# Patient Record
Sex: Female | Born: 1975 | Race: White | Hispanic: No | Marital: Married | Smoking: Current every day smoker
Health system: Southern US, Community
[De-identification: ages and names within clinical notes are randomized; demographics above are authoritative.]

## PROBLEM LIST (undated history)

## (undated) ENCOUNTER — Inpatient Hospital Stay (HOSPITAL_COMMUNITY): Payer: Self-pay

## (undated) DIAGNOSIS — O24419 Gestational diabetes mellitus in pregnancy, unspecified control: Secondary | ICD-10-CM

## (undated) DIAGNOSIS — Z8619 Personal history of other infectious and parasitic diseases: Secondary | ICD-10-CM

## (undated) DIAGNOSIS — E079 Disorder of thyroid, unspecified: Secondary | ICD-10-CM

## (undated) DIAGNOSIS — I10 Essential (primary) hypertension: Secondary | ICD-10-CM

## (undated) DIAGNOSIS — O09529 Supervision of elderly multigravida, unspecified trimester: Secondary | ICD-10-CM

## (undated) DIAGNOSIS — E7800 Pure hypercholesterolemia, unspecified: Secondary | ICD-10-CM

## (undated) DIAGNOSIS — Z3490 Encounter for supervision of normal pregnancy, unspecified, unspecified trimester: Secondary | ICD-10-CM

## (undated) DIAGNOSIS — N96 Recurrent pregnancy loss: Secondary | ICD-10-CM

## (undated) DIAGNOSIS — F17200 Nicotine dependence, unspecified, uncomplicated: Secondary | ICD-10-CM

## (undated) HISTORY — DX: Supervision of elderly multigravida, unspecified trimester: O09.529

## (undated) HISTORY — DX: Disorder of thyroid, unspecified: E07.9

## (undated) HISTORY — DX: Pure hypercholesterolemia, unspecified: E78.00

## (undated) HISTORY — DX: Essential (primary) hypertension: I10

## (undated) HISTORY — DX: Personal history of other infectious and parasitic diseases: Z86.19

## (undated) HISTORY — PX: THYROID SURGERY: SHX805

## (undated) HISTORY — DX: Encounter for supervision of normal pregnancy, unspecified, unspecified trimester: Z34.90

## (undated) HISTORY — DX: Nicotine dependence, unspecified, uncomplicated: F17.200

## (undated) HISTORY — DX: Recurrent pregnancy loss: N96

---

## 2015-12-03 NOTE — L&D Delivery Note (Signed)
40yo now G8P1 s/p NSVD, pregnancy [facility] and GD[facility]. IOL with cytotec, foley and Pitocin.   Delivery Note At 4:53 PM a viable female was delivered via Vaginal, Spontaneous Delivery (Presentation: ROA, compound with hand).  APGAR: 9, 9.  Placenta status: delivered intact with gentle traction   Cord: 3 vessel, with the following complications: none  Anesthesia:  epidural Episiotomy: None Lacerations: Right labial, posterior 1st degree Suture Repair: none Est. Blood Loss (mL): 100 ml    Mom to postpartum.  Baby to Couplet care / Skin to Skin.  Susan Reeves 08/02/2016, 5:16 PM  OB FELLOW DELIVERY ATTESTATION  I was gloved and present for the delivery in its entirety, and I agree with the above resident's note.    I did remove 2 skin tags from pt left interior thigh after delivery. The area was infiltrated with 10 cc's of lidocaine. The mayo scissors where sued to remove skin tag x2.  The areas where hemostatic with pressure.  Susan PennaNicholas Schenk, MD 5:51 PM

## 2015-12-06 ENCOUNTER — Telehealth: Payer: Self-pay | Admitting: Adult Health

## 2015-12-06 ENCOUNTER — Encounter: Payer: Self-pay | Admitting: Adult Health

## 2015-12-06 ENCOUNTER — Ambulatory Visit (INDEPENDENT_AMBULATORY_CARE_PROVIDER_SITE_OTHER): Payer: Federal, State, Local not specified - PPO | Admitting: Adult Health

## 2015-12-06 VITALS — BP 132/82 | HR 96 | Ht 65.5 in | Wt 232.0 lb

## 2015-12-06 DIAGNOSIS — O09521 Supervision of elderly multigravida, first trimester: Secondary | ICD-10-CM

## 2015-12-06 DIAGNOSIS — Z3490 Encounter for supervision of normal pregnancy, unspecified, unspecified trimester: Secondary | ICD-10-CM

## 2015-12-06 DIAGNOSIS — O3680X0 Pregnancy with inconclusive fetal viability, not applicable or unspecified: Secondary | ICD-10-CM

## 2015-12-06 DIAGNOSIS — Z3201 Encounter for pregnancy test, result positive: Secondary | ICD-10-CM

## 2015-12-06 DIAGNOSIS — N926 Irregular menstruation, unspecified: Secondary | ICD-10-CM

## 2015-12-06 DIAGNOSIS — O09899 Supervision of other high risk pregnancies, unspecified trimester: Secondary | ICD-10-CM | POA: Insufficient documentation

## 2015-12-06 DIAGNOSIS — F17200 Nicotine dependence, unspecified, uncomplicated: Secondary | ICD-10-CM

## 2015-12-06 DIAGNOSIS — Z720 Tobacco use: Secondary | ICD-10-CM | POA: Diagnosis not present

## 2015-12-06 DIAGNOSIS — N96 Recurrent pregnancy loss: Secondary | ICD-10-CM

## 2015-12-06 DIAGNOSIS — O09529 Supervision of elderly multigravida, unspecified trimester: Secondary | ICD-10-CM

## 2015-12-06 HISTORY — DX: Nicotine dependence, unspecified, uncomplicated: F17.200

## 2015-12-06 HISTORY — DX: Encounter for supervision of normal pregnancy, unspecified, unspecified trimester: Z34.90

## 2015-12-06 HISTORY — DX: Recurrent pregnancy loss: N96

## 2015-12-06 HISTORY — DX: Supervision of elderly multigravida, unspecified trimester: O09.529

## 2015-12-06 LAB — POCT URINE PREGNANCY: Preg Test, Ur: POSITIVE — AB

## 2015-12-06 MED ORDER — CITRANATAL 90 DHA 90-1 & 300 MG PO MISC: ORAL | Status: DC

## 2015-12-06 NOTE — Patient Instructions (Signed)
First Trimester of Pregnancy The first trimester of pregnancy is from week 1 until the end of week 12 (months 1 through 3). A week after a sperm fertilizes an egg, the egg will implant on the wall of the uterus. This embryo will begin to develop into a baby. Genes from you and your partner are forming the baby. The female genes determine whether the baby is a boy or a girl. At 6-8 weeks, the eyes and face are formed, and the heartbeat can be seen on ultrasound. At the end of 12 weeks, all the baby's organs are formed.  Now that you are pregnant, you will want to do everything you can to have a healthy baby. Two of the most important things are to get good prenatal care and to follow your health care provider's instructions. Prenatal care is all the medical care you receive before the baby's birth. This care will help prevent, find, and treat any problems during the pregnancy and childbirth. BODY CHANGES Your body goes through many changes during pregnancy. The changes vary from woman to woman.   You may gain or lose a couple of pounds at first.  You may feel sick to your stomach (nauseous) and throw up (vomit). If the vomiting is uncontrollable, call your health care provider.  You may tire easily.  You may develop headaches that can be relieved by medicines approved by your health care provider.  You may urinate more often. Painful urination may mean you have a bladder infection.  You may develop heartburn as a result of your pregnancy.  You may develop constipation because certain hormones are causing the muscles that push waste through your intestines to slow down.  You may develop hemorrhoids or swollen, bulging veins (varicose veins).  Your breasts may begin to grow larger and become tender. Your nipples may stick out more, and the tissue that surrounds them (areola) may become darker.  Your gums may bleed and may be sensitive to brushing and flossing.  Dark spots or blotches (chloasma,  mask of pregnancy) may develop on your face. This will likely fade after the baby is born.  Your menstrual periods will stop.  You may have a loss of appetite.  You may develop cravings for certain kinds of food.  You may have changes in your emotions from day to day, such as being excited to be pregnant or being concerned that something may go wrong with the pregnancy and baby.  You may have more vivid and strange dreams.  You may have changes in your hair. These can include thickening of your hair, rapid growth, and changes in texture. Some women also have hair loss during or after pregnancy, or hair that feels dry or thin. Your hair will most likely return to normal after your baby is born. WHAT TO EXPECT AT YOUR PRENATAL VISITS During a routine prenatal visit:  You will be weighed to make sure you and the baby are growing normally.  Your blood pressure will be taken.  Your abdomen will be measured to track your baby's growth.  The fetal heartbeat will be listened to starting around week 10 or 12 of your pregnancy.  Test results from any previous visits will be discussed. Your health care provider may ask you:  How you are feeling.  If you are feeling the baby move.  If you have had any abnormal symptoms, such as leaking fluid, bleeding, severe headaches, or abdominal cramping.  If you are using any tobacco products,   including cigarettes, chewing tobacco, and electronic cigarettes.  If you have any questions. Other tests that may be performed during your first trimester include:  Blood tests to find your blood type and to check for the presence of any previous infections. They will also be used to check for low iron levels (anemia) and Rh antibodies. Later in the pregnancy, blood tests for diabetes will be done along with other tests if problems develop.  Urine tests to check for infections, diabetes, or protein in the urine.  An ultrasound to confirm the proper growth  and development of the baby.  An amniocentesis to check for possible genetic problems.  Fetal screens for spina bifida and Down syndrome.  You may need other tests to make sure you and the baby are doing well.  HIV (human immunodeficiency virus) testing. Routine prenatal testing includes screening for HIV, unless you choose not to have this test. HOME CARE INSTRUCTIONS  Medicines  Follow your health care provider's instructions regarding medicine use. Specific medicines may be either safe or unsafe to take during pregnancy.  Take your prenatal vitamins as directed.  If you develop constipation, try taking a stool softener if your health care provider approves. Diet  Eat regular, well-balanced meals. Choose a variety of foods, such as meat or vegetable-based protein, fish, milk and low-fat dairy products, vegetables, fruits, and whole grain breads and cereals. Your health care provider will help you determine the amount of weight gain that is right for you.  Avoid raw meat and uncooked cheese. These carry germs that can cause birth defects in the baby.  Eating four or five small meals rather than three large meals a day may help relieve nausea and vomiting. If you start to feel nauseous, eating a few soda crackers can be helpful. Drinking liquids between meals instead of during meals also seems to help nausea and vomiting.  If you develop constipation, eat more high-fiber foods, such as fresh vegetables or fruit and whole grains. Drink enough fluids to keep your urine clear or pale yellow. Activity and Exercise  Exercise only as directed by your health care provider. Exercising will help you:  Control your weight.  Stay in shape.  Be prepared for labor and delivery.  Experiencing pain or cramping in the lower abdomen or low back is a good sign that you should stop exercising. Check with your health care provider before continuing normal exercises.  Try to avoid standing for long  periods of time. Move your legs often if you must stand in one place for a long time.  Avoid heavy lifting.  Wear low-heeled shoes, and practice good posture.  You may continue to have sex unless your health care provider directs you otherwise. Relief of Pain or Discomfort  Wear a good support bra for breast tenderness.   Take warm sitz baths to soothe any pain or discomfort caused by hemorrhoids. Use hemorrhoid cream if your health care provider approves.   Rest with your legs elevated if you have leg cramps or low back pain.  If you develop varicose veins in your legs, wear support hose. Elevate your feet for 15 minutes, 3-4 times a day. Limit salt in your diet. Prenatal Care  Schedule your prenatal visits by the twelfth week of pregnancy. They are usually scheduled monthly at first, then more often in the last 2 months before delivery.  Write down your questions. Take them to your prenatal visits.  Keep all your prenatal visits as directed by your   health care provider. Safety  Wear your seat belt at all times when driving.  Make a list of emergency phone numbers, including numbers for family, friends, the hospital, and police and fire departments. General Tips  Ask your health care provider for a referral to a local prenatal education class. Begin classes no later than at the beginning of month 6 of your pregnancy.  Ask for help if you have counseling or nutritional needs during pregnancy. Your health care provider can offer advice or refer you to specialists for help with various needs.  Do not use hot tubs, steam rooms, or saunas.  Do not douche or use tampons or scented sanitary pads.  Do not cross your legs for long periods of time.  Avoid cat litter boxes and soil used by cats. These carry germs that can cause birth defects in the baby and possibly loss of the fetus by miscarriage or stillbirth.  Avoid all smoking, herbs, alcohol, and medicines not prescribed by  your health care provider. Chemicals in these affect the formation and growth of the baby.  Do not use any tobacco products, including cigarettes, chewing tobacco, and electronic cigarettes. If you need help quitting, ask your health care provider. You may receive counseling support and other resources to help you quit.  Schedule a dentist appointment. At home, brush your teeth with a soft toothbrush and be gentle when you floss. SEEK MEDICAL CARE IF:   You have dizziness.  You have mild pelvic cramps, pelvic pressure, or nagging pain in the abdominal area.  You have persistent nausea, vomiting, or diarrhea.  You have a bad smelling vaginal discharge.  You have pain with urination.  You notice increased swelling in your face, hands, legs, or ankles. SEEK IMMEDIATE MEDICAL CARE IF:   You have a fever.  You are leaking fluid from your vagina.  You have spotting or bleeding from your vagina.  You have severe abdominal cramping or pain.  You have rapid weight gain or loss.  You vomit blood or material that looks like coffee grounds.  You are exposed to [place] measles and have never had them.  You are exposed to fifth disease or chickenpox.  You develop a severe headache.  You have shortness of breath.  You have any kind of trauma, such as from a fall or a car accident.   This information is not intended to replace advice given to you by your health care provider. Make sure you discuss any questions you have with your health care provider.   Document Released: 11/12/2001 Document Revised: 12/09/2014 Document Reviewed: 09/28/2013 Elsevier Interactive Patient Education [employer]2016 [employer] Return in 1 day for dating US Decrease cigarettes

## 2015-12-06 NOTE — Progress Notes (Signed)
Subjective:     Patient ID: Susan Reeves, female   DOB: [birth year], 40 y.o.   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  Review of Systems Patient denies any headaches, hearing loss, fatigue, blurred vision, shortness of breath, chest pain, abdominal pain, problems with bowel movements, urination, or intercourse. No joint pain or mood swings. See HPI for positives. Reviewed past medical,surgical, social and family history. Reviewed medications and allergies.     Objective:   Physical Exam BP 132/82 mmHg  Pulse 96  Ht 5' 5.5" (1.664 m)  Wt 232 lb (105.235 kg)  BMI 38.01 kg/m2  LMP 10/22/2015 UPT + about 6+3 weeks by LMP with EDD 07/28/16, Skin warm and dry. Neck: mid line trachea, normal thyroid, good ROM, no lymphadenopathy noted. Lungs: clear to ausculation bilaterally. Cardiovascular: regular rate and rhythm.Abdomen soft non tender. Discussed that she needs to decrease cigarettes, will get labs today, to check thyroid and progesterone level, and then back for US tomorrow, will Rx prenatal vitamins and will discuss labs tomorrow. Face time 20 minutes with pt and husband of 11 years, with 50 % counseling and discussing plans.    Assessment:     Pregnant  History of multiple miscarriages AMA  Smoker     Plan:     Rx citranatal 90 DHA #60 take daily with 11 refills Check TSH, QHCG and progesterone today Return in 1 day for dating US Decrease cigarettes Review handout on first trimester

## 2015-12-06 NOTE — Telephone Encounter (Signed)
Insurance will not cover prenatal vitamins, told pharmacist at [facility] to have pt get OTC prenatal and take 1 daily

## 2015-12-07 ENCOUNTER — Other Ambulatory Visit: Payer: Self-pay | Admitting: Adult Health

## 2015-12-07 ENCOUNTER — Telehealth: Payer: Self-pay | Admitting: Adult Health

## 2015-12-07 ENCOUNTER — Ambulatory Visit (INDEPENDENT_AMBULATORY_CARE_PROVIDER_SITE_OTHER): Payer: Federal, State, Local not specified - PPO

## 2015-12-07 DIAGNOSIS — N96 Recurrent pregnancy loss: Secondary | ICD-10-CM

## 2015-12-07 DIAGNOSIS — O3680X0 Pregnancy with inconclusive fetal viability, not applicable or unspecified: Secondary | ICD-10-CM | POA: Diagnosis not present

## 2015-12-07 DIAGNOSIS — Z3A01 Less than 8 weeks gestation of pregnancy: Secondary | ICD-10-CM

## 2015-12-07 DIAGNOSIS — O3491 Maternal care for abnormality of pelvic organ, unspecified, first trimester: Secondary | ICD-10-CM | POA: Diagnosis not present

## 2015-12-07 LAB — BETA HCG QUANT (REF LAB): HCG QUANT: 2404 m[IU]/mL

## 2015-12-07 LAB — TSH: TSH: 1.53 u[IU]/mL (ref 0.450–4.500)

## 2015-12-07 LAB — PROGESTERONE: PROGESTERONE: 10.7 ng/mL

## 2015-12-07 MED ORDER — PROGESTERONE MICRONIZED 200 MG PO CAPS: ORAL_CAPSULE | ORAL | Status: DC

## 2015-12-07 NOTE — Progress Notes (Signed)
US 5+2wks GS w/ys,no fetal pole seen,normal ov's bilat,mult simple nabothian cysts,pt will come back for f/u ultrasound [facility]

## 2015-12-07 NOTE — Telephone Encounter (Signed)
Pt aware of labs progesterone 10.7 will rx prometrium 200 mg 1 in vagina at hs, discussed with Dr Emelda FearFerguson, US showed IUP

## 2015-12-15 ENCOUNTER — Other Ambulatory Visit: Payer: Self-pay | Admitting: Obstetrics & Gynecology

## 2015-12-15 DIAGNOSIS — O3680X0 Pregnancy with inconclusive fetal viability, not applicable or unspecified: Secondary | ICD-10-CM

## 2015-12-18 ENCOUNTER — Ambulatory Visit (INDEPENDENT_AMBULATORY_CARE_PROVIDER_SITE_OTHER): Payer: Federal, State, Local not specified - PPO

## 2015-12-18 DIAGNOSIS — O3680X0 Pregnancy with inconclusive fetal viability, not applicable or unspecified: Secondary | ICD-10-CM | POA: Diagnosis not present

## 2015-12-18 DIAGNOSIS — Z3A01 Less than 8 weeks gestation of pregnancy: Secondary | ICD-10-CM

## 2015-12-18 NOTE — Progress Notes (Signed)
US 6+4 wks,single IUP pos fht 121 bpm,normal ov's bilat, crl 7.456mm

## 2015-12-21 ENCOUNTER — Ambulatory Visit (INDEPENDENT_AMBULATORY_CARE_PROVIDER_SITE_OTHER): Payer: BLUE CROSS/BLUE SHIELD | Admitting: Advanced Practice Midwife

## 2015-12-21 ENCOUNTER — Encounter: Payer: Self-pay | Admitting: Advanced Practice Midwife

## 2015-12-21 VITALS — BP 142/80 | HR 92 | Wt 232.0 lb

## 2015-12-21 DIAGNOSIS — O10919 Unspecified pre-existing hypertension complicating pregnancy, unspecified trimester: Secondary | ICD-10-CM | POA: Insufficient documentation

## 2015-12-21 DIAGNOSIS — N96 Recurrent pregnancy loss: Secondary | ICD-10-CM

## 2015-12-21 DIAGNOSIS — R030 Elevated blood-pressure reading, without diagnosis of hypertension: Secondary | ICD-10-CM

## 2015-12-21 DIAGNOSIS — Z23 Encounter for immunization: Secondary | ICD-10-CM

## 2015-12-21 DIAGNOSIS — O09521 Supervision of elderly multigravida, first trimester: Secondary | ICD-10-CM | POA: Diagnosis not present

## 2015-12-21 DIAGNOSIS — Z331 Pregnant state, incidental: Secondary | ICD-10-CM

## 2015-12-21 DIAGNOSIS — Z369 Encounter for antenatal screening, unspecified: Secondary | ICD-10-CM

## 2015-12-21 DIAGNOSIS — Z1389 Encounter for screening for other disorder: Secondary | ICD-10-CM

## 2015-12-21 DIAGNOSIS — O09891 Supervision of other high risk pregnancies, first trimester: Secondary | ICD-10-CM

## 2015-12-21 DIAGNOSIS — O2621 Pregnancy care for patient with recurrent pregnancy loss, first trimester: Secondary | ICD-10-CM | POA: Diagnosis not present

## 2015-12-21 LAB — POCT URINALYSIS DIPSTICK
GLUCOSE UA: NEGATIVE
Ketones, UA: NEGATIVE
Leukocytes, UA: NEGATIVE
NITRITE UA: NEGATIVE
PROTEIN UA: NEGATIVE

## 2015-12-21 MED ORDER — ASPIRIN EC 81 MG PO TBEC: 81.0000 mg | DELAYED_RELEASE_TABLET | Freq: Every day | ORAL | Status: DC

## 2015-12-21 NOTE — Patient Instructions (Signed)
Start taking aspirin  (baby aspirin) every day     Safe Medications in Pregnancy   Acne: Benzoyl Peroxide Salicylic Acid  Backache/Headache: Tylenol: 2 regular strength every 4 hours OR              2 Extra strength every 6 hours  Colds/Coughs/Allergies: Benadryl (alcohol free) 25 mg every 6 hours as needed Breath right strips Claritin Cepacol throat lozenges Chloraseptic throat spray Cold-Eeze- up to three times per day Cough drops, alcohol free Flonase (by prescription only) Guaifenesin Mucinex Robitussin DM (plain only, alcohol free) Saline nasal spray/drops Sudafed (pseudoephedrine) & Actifed ** use only after [redacted] weeks gestation and if you do not have high blood pressure Tylenol Vicks Vaporub Zinc lozenges Zyrtec   Constipation: Colace Ducolax suppositories Fleet enema Glycerin suppositories Metamucil Milk of magnesia Miralax Senokot Smooth move tea  Diarrhea: Kaopectate Imodium A-D  *NO pepto Bismol  Hemorrhoids: Anusol Anusol HC Preparation H Tucks  Indigestion: Tums Maalox Mylanta Zantac  Pepcid  Insomnia: Benadryl (alcohol free)  every 6 hours as needed Tylenol PM Unisom, no Gelcaps  Leg Cramps: Tums MagGel  Nausea/Vomiting:  Bonine Dramamine Emetrol Ginger extract Sea bands Meclizine  Nausea medication to take during pregnancy:  Unisom (doxylamine succinate 25 mg tablets) Take one tablet daily at bedtime. If symptoms are not adequately controlled, the dose can be increased to a maximum recommended dose of two tablets daily (1/2 tablet in the morning, 1/2 tablet mid-afternoon and one at bedtime). Vitamin B6  tablets. Take one tablet twice a day (up to 200 mg per day).  Skin Rashes: Aveeno products Benadryl cream or  every 6 hours as needed Calamine Lotion 1% cortisone cream  Yeast infection: Gyne-lotrimin 7 Monistat 7   **If taking multiple medications, please check labels to avoid duplicating the  same active ingredients **take medication as directed on the label ** Do not exceed 4000 mg of tylenol in 24 hours **Do not take medications that contain aspirin or ibuprofen    First Trimester of Pregnancy The first trimester of pregnancy is from week 1 until the end of week 12 (months 1 through 3). A week after a sperm fertilizes an egg, the egg will implant on the wall of the uterus. This embryo will begin to develop into a baby. Genes from you and your partner are forming the baby. The female genes determine whether the baby is a boy or a girl. At 6-8 weeks, the eyes and face are formed, and the heartbeat can be seen on ultrasound. At the end of 12 weeks, all the baby's organs are formed.  Now that you are pregnant, you will want to do everything you can to have a healthy baby. Two of the most important things are to get good prenatal care and to follow your health care provider's instructions. Prenatal care is all the medical care you receive before the baby's birth. This care will help prevent, find, and treat any problems during the pregnancy and childbirth. BODY CHANGES Your body goes through many changes during pregnancy. The changes vary from woman to woman.   You may gain or lose a couple of pounds at first.  You may feel sick to your stomach (nauseous) and throw up (vomit). If the vomiting is uncontrollable, call your health care provider.  You may tire easily.  You may develop headaches that can be relieved by medicines approved by your health care provider.  You may urinate more often. Painful urination may mean you have a  bladder infection.  You may develop heartburn as a result of your pregnancy.  You may develop constipation because certain hormones are causing the muscles that push waste through your intestines to slow down.  You may develop hemorrhoids or swollen, bulging veins (varicose veins).  Your breasts may begin to grow larger and become tender. Your nipples may  stick out more, and the tissue that surrounds them (areola) may become darker.  Your gums may bleed and may be sensitive to brushing and flossing.  Dark spots or blotches (chloasma, mask of pregnancy) may develop on your face. This will likely fade after the baby is born.  Your menstrual periods will stop.  You may have a loss of appetite.  You may develop cravings for certain kinds of food.  You may have changes in your emotions from day to day, such as being excited to be pregnant or being concerned that something may go wrong with the pregnancy and baby.  You may have more vivid and strange dreams.  You may have changes in your hair. These can include thickening of your hair, rapid growth, and changes in texture. Some women also have hair loss during or after pregnancy, or hair that feels dry or thin. Your hair will most likely return to normal after your baby is born. WHAT TO EXPECT AT YOUR PRENATAL VISITS During a routine prenatal visit:  You will be weighed to make sure you and the baby are growing normally.  Your blood pressure will be taken.  Your abdomen will be measured to track your baby's growth.  The fetal heartbeat will be listened to starting around week 10 or 12 of your pregnancy.  Test results from any previous visits will be discussed. Your health care provider may ask you:  How you are feeling.  If you are feeling the baby move.  If you have had any abnormal symptoms, such as leaking fluid, bleeding, severe headaches, or abdominal cramping.  If you are using any tobacco products, including cigarettes, chewing tobacco, and electronic cigarettes.  If you have any questions. Other tests that may be performed during your first trimester include:  Blood tests to find your blood type and to check for the presence of any previous infections. They will also be used to check for low iron levels (anemia) and Rh antibodies. Later in the pregnancy, blood tests for  diabetes will be done along with other tests if problems develop.  Urine tests to check for infections, diabetes, or protein in the urine.  An ultrasound to confirm the proper growth and development of the baby.  An amniocentesis to check for possible genetic problems.  Fetal screens for spina bifida and Down syndrome.  You may need other tests to make sure you and the baby are doing well.  HIV (human immunodeficiency virus) testing. Routine prenatal testing includes screening for HIV, unless you choose not to have this test. HOME CARE INSTRUCTIONS  Medicines  Follow your health care provider's instructions regarding medicine use. Specific medicines may be either safe or unsafe to take during pregnancy.  Take your prenatal vitamins as directed.  If you develop constipation, try taking a stool softener if your health care provider approves. Diet  Eat regular, well-balanced meals. Choose a variety of foods, such as meat or vegetable-based protein, fish, milk and low-fat dairy products, vegetables, fruits, and whole grain breads and cereals. Your health care provider will help you determine the amount of weight gain that is right for you.  Avoid raw meat and uncooked cheese. These carry germs that can cause birth defects in the baby.  Eating four or five small meals rather than three large meals a day may help relieve nausea and vomiting. If you start to feel nauseous, eating a few soda crackers can be helpful. Drinking liquids between meals instead of during meals also seems to help nausea and vomiting.  If you develop constipation, eat more high-fiber foods, such as fresh vegetables or fruit and whole grains. Drink enough fluids to keep your urine clear or pale yellow. Activity and Exercise  Exercise only as directed by your health care provider. Exercising will help you:  Control your weight.  Stay in shape.  Be prepared for labor and delivery.  Experiencing pain or cramping  in the lower abdomen or low back is a good sign that you should stop exercising. Check with your health care provider before continuing normal exercises.  Try to avoid standing for long periods of time. Move your legs often if you must stand in one place for a long time.  Avoid heavy lifting.  Wear low-heeled shoes, and practice good posture.  You may continue to have sex unless your health care provider directs you otherwise. Relief of Pain or Discomfort  Wear a good support bra for breast tenderness.   Take warm sitz baths to soothe any pain or discomfort caused by hemorrhoids. Use hemorrhoid cream if your health care provider approves.   Rest with your legs elevated if you have leg cramps or low back pain.  If you develop varicose veins in your legs, wear support hose. Elevate your feet for 15 minutes, 3-4 times a day. Limit salt in your diet. Prenatal Care  Schedule your prenatal visits by the twelfth week of pregnancy. They are usually scheduled monthly at first, then more often in the last 2 months before delivery.  Write down your questions. Take them to your prenatal visits.  Keep all your prenatal visits as directed by your health care provider. Safety  Wear your seat belt at all times when driving.  Make a list of emergency phone numbers, including numbers for family, friends, the hospital, and police and fire departments. General Tips  Ask your health care provider for a referral to a local prenatal education class. Begin classes no later than at the beginning of month 6 of your pregnancy.  Ask for help if you have counseling or nutritional needs during pregnancy. Your health care provider can offer advice or refer you to specialists for help with various needs.  Do not use hot tubs, steam rooms, or saunas.  Do not douche or use tampons or scented sanitary pads.  Do not cross your legs for long periods of time.  Avoid cat litter boxes and soil used by cats.  These carry germs that can cause birth defects in the baby and possibly loss of the fetus by miscarriage or stillbirth.  Avoid all smoking, herbs, alcohol, and medicines not prescribed by your health care provider. Chemicals in these affect the formation and growth of the baby.  Do not use any tobacco products, including cigarettes, chewing tobacco, and electronic cigarettes. If you need help quitting, ask your health care provider. You may receive counseling support and other resources to help you quit.  Schedule a dentist appointment. At home, brush your teeth with a soft toothbrush and be gentle when you floss. SEEK MEDICAL CARE IF:   You have dizziness.  You have mild pelvic cramps, pelvic  pressure, or nagging pain in the abdominal area.  You have persistent nausea, vomiting, or diarrhea.  You have a bad smelling vaginal discharge.  You have pain with urination.  You notice increased swelling in your face, hands, legs, or ankles. SEEK IMMEDIATE MEDICAL CARE IF:   You have a fever.  You are leaking fluid from your vagina.  You have spotting or bleeding from your vagina.  You have severe abdominal cramping or pain.  You have rapid weight gain or loss.  You vomit blood or material that looks like coffee grounds.  You are exposed to [place] measles and have never had them.  You are exposed to fifth disease or chickenpox.  You develop a severe headache.  You have shortness of breath.  You have any kind of trauma, such as from a fall or a car accident.   This information is not intended to replace advice given to you by your health care provider. Make sure you discuss any questions you have with your health care provider.   Document Released: 11/12/2001 Document Revised: 12/09/2014 Document Reviewed: 09/28/2013 Elsevier Interactive Patient Education [employer]

## 2015-12-21 NOTE — Progress Notes (Addendum)
Subjective:    Susan Reeves is a [id number] [redacted]w[redacted]d being seen today for her first obstetrical visit.  Her obstetrical history is significant for advanced maternal age.  Pregnancy history fully reviewed.  She states she has had 6 1st trimester SABs, never had to have a D&C.  She has  had a habitual aborter workup that was negative.  She stopped trying to get pregnant a few years ago and got pregnant with out assistance.  Pg level 10.7, so although pg supplementation may or may not help, it will not hurt, so it was inititated.  Pt had an abnormal pap that required bx about 6 years ago, bx negative. Pt states that she has white coat syndrome, but BP is "always normal" by end of visit. May consider home monitoring if this becomes a question.   Patient reports no complaints.  Filed Vitals:   12/21/15 1456  BP: 142/80  Pulse: 92  Weight: 232 lb (105.235 kg)    HISTORY: OB History  Gravida Para Term Preterm AB SAB TAB Ectopic Multiple Living  0    # Outcome Date GA Lbr Len/2nd Weight Sex Delivery Anes PTL Lv  8 Current           7 TAB           6 SAB           5 SAB           4 SAB           3 SAB           2 SAB           1 SAB              Past Medical History  Diagnosis Date  . 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 Pregnant 12/06/2015  . 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 AMA (advanced maternal age) multigravida 35+ 12/06/2015  . Smoker 12/06/2015  . History of HPV infection    Past Surgical History  Procedure Laterality Date  . Thyroid surgery     Family History  Problem Relation Age of Onset  . Diabetes Mother   . Stroke Mother   . Thyroid disease Mother   . Diabetes Father   . Hyperlipidemia Father   . Diabetes Maternal Grandmother   . Cancer Maternal Grandmother     stomach  . Diabetes Paternal Grandmother   . Heart attack Paternal Grandfather      Exam                                      System:     Skin: normal coloration and  turgor, no rashes    Neurologic: oriented, normal, normal mood   Extremities: normal strength, tone, and muscle mass   HEENT PERRLA   Mouth/Teeth mucous membranes moist, normal dentition   Neck supple and no masses   Cardiovascular: regular rate and rhythm   Respiratory:  appears well, vitals normal, no respiratory distress, acyanotic   Abdomen: soft, non-tender;  FHR: 160US          Assessment:    Pregnancy: G8P0070 Patient Active Problem List   Diagnosis Date Noted  . White coat syndrome without diagnosis of hypertension 12/21/2015  . Supervision of other high-risk pregnancy 12/06/2015  . History  of multiple miscarriages 12/06/2015  . AMA (advanced maternal age) 30+ 12/06/2015  . Smoker 12/06/2015        Plan:     Initial labs drawn. Start ASA ; continue prometrium until 14 weeks Continue prenatal vitamins  Problem list reviewed and updated  Reviewed n/v relief measures and warning s/s to report  Reviewed recommended weight gain based on pre-gravid BMI  Encouraged well-balanced diet Genetic Screening discussed  Integrated screen: CfDNA: will think about it  Ultrasound discussed; fetal survey: requested.  Return in about 4 weeks (around 01/18/2016) for HROB.  CRESENZO-DISHMAN,Ashleen Demma 12/21/2015   Did internal [place] before pap collected (oops) so will get pap at next visit.

## 2015-12-22 LAB — VARICELLA ZOSTER ANTIBODY, IGG: VARICELLA: 2288 {index} (ref >165)

## 2015-12-22 LAB — CBC
HEMOGLOBIN: 14.5 g/dL (ref 11.1–15.9)
Hematocrit: 43.1 % (ref 34.0–46.6)
MCH: 31.3 pg (ref 26.6–33.0)
MCHC: 33.6 g/dL (ref 31.5–35.7)
MCV: 93 fL (ref 79–97)
Platelets: 306 10*3/uL (ref 150–379)
RBC: 4.63 x10E6/uL (ref 3.77–5.28)
RDW: 13.2 % (ref 12.3–15.4)
WBC: 11.9 10*3/uL — AB (ref 3.4–10.8)

## 2015-12-22 LAB — PMP SCREEN PROFILE (10S), URINE
AMPHETAMINE SCRN UR: NEGATIVE ng/mL
Barbiturate Screen, Ur: NEGATIVE ng/mL
Benzodiazepine Screen, Urine: NEGATIVE ng/mL
CANNABINOIDS UR QL SCN: NEGATIVE ng/mL
CREATININE(CRT), U: 222.5 mg/dL (ref 20.0–300.0)
Cocaine(Metab.)Screen, Urine: NEGATIVE ng/mL
Methadone Scn, Ur: NEGATIVE ng/mL
OXYCODONE+OXYMORPHONE UR QL SCN: NEGATIVE ng/mL
Opiate Scrn, Ur: NEGATIVE ng/mL
PCP SCRN UR: NEGATIVE ng/mL
PH UR, DRUG SCRN: 5.9 (ref 4.5–8.9)
PROPOXYPHENE SCREEN: NEGATIVE ng/mL

## 2015-12-22 LAB — URINALYSIS, ROUTINE W REFLEX MICROSCOPIC
Bilirubin, UA: NEGATIVE
GLUCOSE, UA: NEGATIVE
Ketones, UA: NEGATIVE
LEUKOCYTES UA: NEGATIVE
Nitrite, UA: NEGATIVE
PH UA: 6 (ref 5.0–7.5)
PROTEIN UA: NEGATIVE
RBC, UA: NEGATIVE
Specific Gravity, UA: >=1.03 — AB (ref 1.005–1.030)
UUROB: 0.2 mg/dL (ref 0.2–1.0)

## 2015-12-22 LAB — RPR: RPR Ser Ql: NONREACTIVE

## 2015-12-22 LAB — ANTIBODY SCREEN: Antibody Screen: NEGATIVE

## 2015-12-22 LAB — ABO/RH: RH TYPE: POSITIVE

## 2015-12-22 LAB — HEPATITIS B SURFACE ANTIGEN: HEP B S AG: NEGATIVE

## 2015-12-22 LAB — HIV ANTIBODY (ROUTINE TESTING W REFLEX): HIV SCREEN 4TH GENERATION: NONREACTIVE

## 2015-12-22 LAB — RUBELLA SCREEN: Rubella Antibodies, IGG: 3.22 index (ref >0.99)

## 2015-12-23 LAB — URINE CULTURE

## 2016-01-03 ENCOUNTER — Telehealth: Payer: Self-pay | Admitting: Advanced Practice Midwife

## 2016-01-03 NOTE — Telephone Encounter (Signed)
Spoke with pt. Pt is wanting ITNT. Advised that is done between 12-13 weeks. Pt voiced understanding. Call transferred to front desk for appt. JSY

## 2016-01-15 ENCOUNTER — Encounter: Payer: Federal, State, Local not specified - PPO | Admitting: Obstetrics and Gynecology

## 2016-01-23 ENCOUNTER — Other Ambulatory Visit: Payer: Self-pay | Admitting: Obstetrics and Gynecology

## 2016-01-23 DIAGNOSIS — Z3682 Encounter for antenatal screening for nuchal translucency: Secondary | ICD-10-CM

## 2016-01-24 ENCOUNTER — Encounter: Payer: Self-pay | Admitting: Obstetrics and Gynecology

## 2016-01-24 ENCOUNTER — Ambulatory Visit (INDEPENDENT_AMBULATORY_CARE_PROVIDER_SITE_OTHER): Payer: BLUE CROSS/BLUE SHIELD | Admitting: Obstetrics and Gynecology

## 2016-01-24 ENCOUNTER — Ambulatory Visit (INDEPENDENT_AMBULATORY_CARE_PROVIDER_SITE_OTHER): Payer: BLUE CROSS/BLUE SHIELD

## 2016-01-24 VITALS — BP 140/80 | HR 84 | Wt 232.6 lb

## 2016-01-24 DIAGNOSIS — O09891 Supervision of other high risk pregnancies, first trimester: Secondary | ICD-10-CM

## 2016-01-24 DIAGNOSIS — Z1389 Encounter for screening for other disorder: Secondary | ICD-10-CM

## 2016-01-24 DIAGNOSIS — Z331 Pregnant state, incidental: Secondary | ICD-10-CM

## 2016-01-24 DIAGNOSIS — Z3A12 12 weeks gestation of pregnancy: Secondary | ICD-10-CM

## 2016-01-24 DIAGNOSIS — Z36 Encounter for antenatal screening of mother: Secondary | ICD-10-CM

## 2016-01-24 DIAGNOSIS — R030 Elevated blood-pressure reading, without diagnosis of hypertension: Secondary | ICD-10-CM

## 2016-01-24 DIAGNOSIS — O09529 Supervision of elderly multigravida, unspecified trimester: Secondary | ICD-10-CM

## 2016-01-24 DIAGNOSIS — Z3682 Encounter for antenatal screening for nuchal translucency: Secondary | ICD-10-CM

## 2016-01-24 DIAGNOSIS — O09521 Supervision of elderly multigravida, first trimester: Secondary | ICD-10-CM

## 2016-01-24 LAB — POCT URINALYSIS DIPSTICK
Glucose, UA: NEGATIVE
Ketones, UA: NEGATIVE
Leukocytes, UA: NEGATIVE
Nitrite, UA: NEGATIVE
PROTEIN UA: NEGATIVE
RBC UA: NEGATIVE

## 2016-01-24 NOTE — Progress Notes (Signed)
[place] 11+6wks,measurements c/w dates,normal ov's bilat,NT 1.3 mm,NB present,crl 61.61mm,fhr 167bpm

## 2016-01-24 NOTE — Progress Notes (Signed)
Patient ID: Susan Reeves, female   DOB: 1976-10-10, 40 y.o.   MRN: [id number]  High Risk Pregnancy Diagnosis(es):  Advanced maternal age and HTN  [id number] [redacted]w[redacted]d Estimated Date of Delivery: 08/08/16   HPI: The patient is being seen today for ongoing management of high risk OB care. Today she reports no complaints.  Patient reports good fetal movement, denies any bleeding and no rupture of membranes symptoms or regular contractions.  BP weight and urine results all reviewed and noted. Blood pressure 140/80, pulse 84, weight 232 lb 9.6 oz (105.507 kg), last menstrual period 10/22/2015.  Fetal Surveillance Testing today:  Not done  Fundal Height:  Not done   Fetal Heart rate:  167 per [place] Edema:  none Urinalysis: Negative  Questions were answered.  Lab and sonogram results have been reviewed. Comments: normal   Assessment:   1.  Pregnancy at [redacted]w[redacted]d,  Estimated Date of Delivery: 08/08/16 :   2.  AMA  Medication(s) Plans: No changes   Treatment Plan:   1. Follow up in 4 weeks for appointment for high risk OB care and labs  Completion of IT testing  By signing my name below, I, Marica Otter, attest that this documentation has been prepared under the direction and in the presence of Christin Bach, MD. Electronically Signed: Marica Otter, ED Scribe. 01/24/2016. 10:15 AM.   I personally performed the services described in this documentation, which was SCRIBED in my presence. The recorded information has been reviewed and considered accurate. It has been edited as necessary during review. Tilda Burrow, MD

## 2016-01-26 LAB — MATERNAL SCREEN, INTEGRATED #1
CROWN RUMP LENGTH MAT SCREEN: 61 mm
GEST. AGE ON COLLECTION DATE: 12.4 wk
MATERNAL AGE AT EDD: 41.4 a
NUCHAL TRANSLUCENCY (NT): 1.3 mm
NUMBER OF FETUSES: 1
PAPP-A Value: 256.9 ng/mL
WEIGHT: 233 [lb_av]

## 2016-02-19 ENCOUNTER — Encounter: Payer: Self-pay | Admitting: Women's Health

## 2016-02-19 ENCOUNTER — Other Ambulatory Visit (HOSPITAL_COMMUNITY)
Admission: RE | Admit: 2016-02-19 | Discharge: 2016-02-19 | Disposition: A | Discharge status: Home / Self Care | Payer: BLUE CROSS/BLUE SHIELD | Source: Ambulatory Visit | Attending: Obstetrics & Gynecology | Admitting: Obstetrics & Gynecology

## 2016-02-19 ENCOUNTER — Ambulatory Visit (INDEPENDENT_AMBULATORY_CARE_PROVIDER_SITE_OTHER): Payer: BLUE CROSS/BLUE SHIELD | Admitting: Women's Health

## 2016-02-19 VITALS — BP 140/72 | HR 89 | Wt 234.0 lb

## 2016-02-19 DIAGNOSIS — Z01419 Encounter for gynecological examination (general) (routine) without abnormal findings: Secondary | ICD-10-CM | POA: Diagnosis present

## 2016-02-19 DIAGNOSIS — F17200 Nicotine dependence, unspecified, uncomplicated: Secondary | ICD-10-CM | POA: Diagnosis not present

## 2016-02-19 DIAGNOSIS — R030 Elevated blood-pressure reading, without diagnosis of hypertension: Secondary | ICD-10-CM

## 2016-02-19 DIAGNOSIS — Z113 Encounter for screening for infections with a predominantly sexual mode of transmission: Secondary | ICD-10-CM | POA: Diagnosis present

## 2016-02-19 DIAGNOSIS — IMO0001 Reserved for inherently not codable concepts without codable children: Secondary | ICD-10-CM

## 2016-02-19 DIAGNOSIS — Z331 Pregnant state, incidental: Secondary | ICD-10-CM

## 2016-02-19 DIAGNOSIS — O09892 Supervision of other high risk pregnancies, second trimester: Secondary | ICD-10-CM

## 2016-02-19 DIAGNOSIS — Z369 Encounter for antenatal screening, unspecified: Secondary | ICD-10-CM

## 2016-02-19 DIAGNOSIS — Z1151 Encounter for screening for human papillomavirus (HPV): Secondary | ICD-10-CM | POA: Insufficient documentation

## 2016-02-19 DIAGNOSIS — Z363 Encounter for antenatal screening for malformations: Secondary | ICD-10-CM

## 2016-02-19 DIAGNOSIS — O99332 Smoking (tobacco) complicating pregnancy, second trimester: Secondary | ICD-10-CM | POA: Diagnosis not present

## 2016-02-19 DIAGNOSIS — O09522 Supervision of elderly multigravida, second trimester: Secondary | ICD-10-CM

## 2016-02-19 DIAGNOSIS — Z1389 Encounter for screening for other disorder: Secondary | ICD-10-CM

## 2016-02-19 LAB — POCT URINALYSIS DIPSTICK
Glucose, UA: NEGATIVE
Ketones, UA: NEGATIVE
Leukocytes, UA: NEGATIVE
NITRITE UA: NEGATIVE
PROTEIN UA: NEGATIVE
RBC UA: NEGATIVE

## 2016-02-19 NOTE — Progress Notes (Signed)
High Risk Pregnancy Diagnosis(es): AMA, likely CHTN G8P0070 [id number] Estimated Date of Delivery: 08/08/16 BP 140/72 mmHg  Pulse 89  Wt 234 lb (106.142 kg)  LMP 10/22/2015  Urinalysis: Negative HPI:  States bp always high at MDs, was rx'd medicine at one time but never started. She believes it is White Coat Syndrome. To begin checking bp's bid at home, bring log to next appt.  Smokes 1/2ppd down from 1.5ppd- discussed risks to herself and fetus during pregnancy and after- advised smoking cessation, offered Quitline [place]- declines. Wants to try gum.  Did not have pap last visit, will do today BP, weight, and urine reviewed.  No fm yet. Denies cramping, lof, vb, uti s/s. No complaints.  Fundal Height:  15wks Fetal Heart rate:  147 Edema:  None Thin prep pap obtained  Reviewed warning s/s to report.  All questions were answered Assessment: [id number] AMA, likely CHTN Medication(s) Plans:  Continue baby ASA Treatment Plan:  To begin checking bp's bid at home and bring log to next appt Follow up in 4wks for high-risk OB appt and anatomy u/s 2nd IT today

## 2016-02-19 NOTE — Patient Instructions (Addendum)
Begin taking your blood pressure at home twice daily (once in the morning, once in the evening when resting)- keep in a log and bring to your next visit  Second Trimester of Pregnancy The second trimester is from week 13 through week 28, months 4 through 6. The second trimester is often a time when you feel your best. Your body has also adjusted to being pregnant, and you begin to feel better physically. Usually, morning sickness has lessened or quit completely, you may have more energy, and you may have an increase in appetite. The second trimester is also a time when the fetus is growing rapidly. At the end of the sixth month, the fetus is about 9 inches long and weighs about 1 pounds. You will likely begin to feel the baby move (quickening) between 18 and 20 weeks of the pregnancy. BODY CHANGES Your body goes through many changes during pregnancy. The changes vary from woman to woman.   Your weight will continue to increase. You will notice your lower abdomen bulging out.  You may begin to get stretch marks on your hips, abdomen, and breasts.  You may develop headaches that can be relieved by medicines approved by your health care provider.  You may urinate more often because the fetus is pressing on your bladder.  You may develop or continue to have heartburn as a result of your pregnancy.  You may develop constipation because certain hormones are causing the muscles that push waste through your intestines to slow down.  You may develop hemorrhoids or swollen, bulging veins (varicose veins).  You may have back pain because of the weight gain and pregnancy hormones relaxing your joints between the bones in your pelvis and as a result of a shift in weight and the muscles that support your balance.  Your breasts will continue to grow and be tender.  Your gums may bleed and may be sensitive to brushing and flossing.  Dark spots or blotches (chloasma, mask of pregnancy) may develop on your  face. This will likely fade after the baby is born.  A dark line from your belly button to the pubic area (linea nigra) may appear. This will likely fade after the baby is born.  You may have changes in your hair. These can include thickening of your hair, rapid growth, and changes in texture. Some women also have hair loss during or after pregnancy, or hair that feels dry or thin. Your hair will most likely return to normal after your baby is born. WHAT TO EXPECT AT YOUR PRENATAL VISITS During a routine prenatal visit:  You will be weighed to make sure you and the fetus are growing normally.  Your blood pressure will be taken.  Your abdomen will be measured to track your baby's growth.  The fetal heartbeat will be listened to.  Any test results from the previous visit will be discussed. Your health care provider may ask you:  How you are feeling.  If you are feeling the baby move.  If you have had any abnormal symptoms, such as leaking fluid, bleeding, severe headaches, or abdominal cramping.  If you are using any tobacco products, including cigarettes, chewing tobacco, and electronic cigarettes.  If you have any questions. Other tests that may be performed during your second trimester include:  Blood tests that check for:  Low iron levels (anemia).  Gestational diabetes (between 24 and 28 weeks).  Rh antibodies.  Urine tests to check for infections, diabetes, or protein in  the urine.  An ultrasound to confirm the proper growth and development of the baby.  An amniocentesis to check for possible genetic problems.  Fetal screens for spina bifida and Down syndrome.  HIV (human immunodeficiency virus) testing. Routine prenatal testing includes screening for HIV, unless you choose not to have this test. HOME CARE INSTRUCTIONS   Avoid all smoking, herbs, alcohol, and unprescribed drugs. These chemicals affect the formation and growth of the baby.  Do not use any tobacco  products, including cigarettes, chewing tobacco, and electronic cigarettes. If you need help quitting, ask your health care provider. You may receive counseling support and other resources to help you quit.  Follow your health care provider's instructions regarding medicine use. There are medicines that are either safe or unsafe to take during pregnancy.  Exercise only as directed by your health care provider. Experiencing uterine cramps is a good sign to stop exercising.  Continue to eat regular, healthy meals.  Wear a good support bra for breast tenderness.  Do not use hot tubs, steam rooms, or saunas.  Wear your seat belt at all times when driving.  Avoid raw meat, uncooked cheese, cat litter boxes, and soil used by cats. These carry germs that can cause birth defects in the baby.  Take your prenatal vitamins.  Take 1500-2000 mg of calcium daily starting at the 20th week of pregnancy until you deliver your baby.  Try taking a stool softener (if your health care provider approves) if you develop constipation. Eat more high-fiber foods, such as fresh vegetables or fruit and whole grains. Drink plenty of fluids to keep your urine clear or pale yellow.  Take warm sitz baths to soothe any pain or discomfort caused by hemorrhoids. Use hemorrhoid cream if your health care provider approves.  If you develop varicose veins, wear support hose. Elevate your feet for 15 minutes, 3-4 times a day. Limit salt in your diet.  Avoid heavy lifting, wear low heel shoes, and practice good posture.  Rest with your legs elevated if you have leg cramps or low back pain.  Visit your dentist if you have not gone yet during your pregnancy. Use a soft toothbrush to brush your teeth and be gentle when you floss.  A sexual relationship may be continued unless your health care provider directs you otherwise.  Continue to go to all your prenatal visits as directed by your health care provider. SEEK MEDICAL  CARE IF:   You have dizziness.  You have mild pelvic cramps, pelvic pressure, or nagging pain in the abdominal area.  You have persistent nausea, vomiting, or diarrhea.  You have a bad smelling vaginal discharge.  You have pain with urination. SEEK IMMEDIATE MEDICAL CARE IF:   You have a fever.  You are leaking fluid from your vagina.  You have spotting or bleeding from your vagina.  You have severe abdominal cramping or pain.  You have rapid weight gain or loss.  You have shortness of breath with chest pain.  You notice sudden or extreme swelling of your face, hands, ankles, feet, or legs.  You have not felt your baby move in over an hour.  You have severe headaches that do not go away with medicine.  You have vision changes.   This information is not intended to replace advice given to you by your health care provider. Make sure you discuss any questions you have with your health care provider.   Document Released: 11/12/2001 Document Revised: 12/09/2014 Document Reviewed:  01/19/2013 Elsevier Interactive Patient Education 2016 [employer]  Smoking Cessation, Tips for Success If you are ready to quit smoking, congratulations! You have chosen to help yourself be healthier. Cigarettes bring nicotine, tar, carbon monoxide, and other irritants into your body. Your lungs, heart, and blood vessels will be able to work better without these poisons. There are many different ways to quit smoking. Nicotine gum, nicotine patches, a nicotine inhaler, or nicotine nasal spray can help with physical craving. Hypnosis, support groups, and medicines help break the habit of smoking. WHAT THINGS CAN I DO TO MAKE QUITTING EASIER?  Here are some tips to help you quit for good:  Pick a date when you will quit smoking completely. Tell all of your friends and family about your plan to quit on that date.  Do not try to slowly cut down on the number of cigarettes you are smoking. Pick a quit  date and quit smoking completely starting on that day.  Throw away all cigarettes.   Clean and remove all ashtrays from your home, work, and car.  On a card, write down your reasons for quitting. Carry the card with you and read it when you get the urge to smoke.  Cleanse your body of nicotine. Drink enough water and fluids to keep your urine clear or pale yellow. Do this after quitting to flush the nicotine from your body.  Learn to predict your moods. Do not let a bad situation be your excuse to have a cigarette. Some situations in your life might tempt you into wanting a cigarette.  Never have "just one" cigarette. It leads to wanting another and another. Remind yourself of your decision to quit.  Change habits associated with smoking. If you smoked while driving or when feeling stressed, try other activities to replace smoking. Stand up when drinking your coffee. Brush your teeth after eating. Sit in a different chair when you read the paper. Avoid alcohol while trying to quit, and try to drink fewer caffeinated beverages. Alcohol and caffeine may urge you to smoke.  Avoid foods and drinks that can trigger a desire to smoke, such as sugary or spicy foods and alcohol.  Ask people who smoke not to smoke around you.  Have something planned to do right after eating or having a cup of coffee. For example, plan to take a walk or exercise.  Try a relaxation exercise to calm you down and decrease your stress. Remember, you may be tense and nervous for the first 2 weeks after you quit, but this will pass.  Find new activities to keep your hands busy. Play with a pen, coin, or rubber band. Doodle or draw things on paper.  Brush your teeth right after eating. This will help cut down on the craving for the taste of tobacco after meals. You can also try mouthwash.   Use oral substitutes in place of cigarettes. Try using lemon drops, carrots, cinnamon sticks, or chewing gum. Keep them handy so  they are available when you have the urge to smoke.  When you have the urge to smoke, try deep breathing.  Designate your home as a nonsmoking area.  If you are a heavy smoker, ask your health care provider about a prescription for nicotine chewing gum. It can ease your withdrawal from nicotine.  Reward yourself. Set aside the cigarette money you save and buy yourself something nice.  Look for support from others. Join a support group or smoking cessation program. Ask someone at  home or at work to help you with your plan to quit smoking.  Always ask yourself, "Do I need this cigarette or is this just a reflex?" Tell yourself, "Today, I choose not to smoke," or "I do not want to smoke." You are reminding yourself of your decision to quit.  Do not replace cigarette smoking with electronic cigarettes (commonly called e-cigarettes). The safety of e-cigarettes is unknown, and some may contain harmful chemicals.  If you relapse, do not give up! Plan ahead and think about what you will do the next time you get the urge to smoke. HOW WILL I FEEL WHEN I QUIT SMOKING? You may have symptoms of withdrawal because your body is used to nicotine (the addictive substance in cigarettes). You may crave cigarettes, be irritable, feel very hungry, cough often, get headaches, or have difficulty concentrating. The withdrawal symptoms are only temporary. They are strongest when you first quit but will go away within 10-14 days. When withdrawal symptoms occur, stay in control. Think about your reasons for quitting. Remind yourself that these are signs that your body is healing and getting used to being without cigarettes. Remember that withdrawal symptoms are easier to treat than the major diseases that smoking can cause.  Even after the withdrawal is over, expect periodic urges to smoke. However, these cravings are generally short lived and will go away whether you smoke or not. Do not smoke! WHAT RESOURCES ARE  AVAILABLE TO HELP ME QUIT SMOKING? Your health care provider can direct you to community resources or hospitals for support, which may include:  Group support.  Education.  Hypnosis.  Therapy.   This information is not intended to replace advice given to you by your health care provider. Make sure you discuss any questions you have with your health care provider.   Document Released: 08/16/2004 Document Revised: 12/09/2014 Document Reviewed: 05/06/2013 Elsevier Interactive Patient Education [employer]

## 2016-02-19 NOTE — Progress Notes (Signed)
Pt denies any problems or concerns at this time.  

## 2016-02-20 LAB — CYTOLOGY - PAP

## 2016-02-21 LAB — MATERNAL SCREEN, INTEGRATED #2
ADSF: 0.73
AFP MoM: 1.42
Alpha-Fetoprotein: 31.1 ng/mL
Crown Rump Length: 61 mm
DIA MOM: 2
DIA VALUE: 275.3 pg/mL
Estriol, Unconjugated: 0.53 ng/mL
Gest. Age on Collection Date: 12.4 weeks
Gestational Age: 16.1 weeks
HCG MOM: 1.73
HCG VALUE: 43.7 [IU]/mL
MATERNAL AGE AT EDD: 41.4 a
NUCHAL TRANSLUCENCY (NT): 1.3 mm
NUCHAL TRANSLUCENCY MOM: 0.84
NUMBER OF FETUSES: 1
PAPP-A MOM: 0.46
PAPP-A VALUE: 256.9 ng/mL
Test Results:: NEGATIVE
WEIGHT: 233 [lb_av]
Weight: 233 [lb_av]

## 2016-03-18 ENCOUNTER — Ambulatory Visit (INDEPENDENT_AMBULATORY_CARE_PROVIDER_SITE_OTHER): Payer: BLUE CROSS/BLUE SHIELD | Admitting: Women's Health

## 2016-03-18 ENCOUNTER — Encounter: Payer: Self-pay | Admitting: Women's Health

## 2016-03-18 ENCOUNTER — Ambulatory Visit (INDEPENDENT_AMBULATORY_CARE_PROVIDER_SITE_OTHER): Payer: BLUE CROSS/BLUE SHIELD

## 2016-03-18 VITALS — BP 138/80 | HR 84 | Wt 235.0 lb

## 2016-03-18 DIAGNOSIS — Z363 Encounter for antenatal screening for malformations: Secondary | ICD-10-CM

## 2016-03-18 DIAGNOSIS — O09522 Supervision of elderly multigravida, second trimester: Secondary | ICD-10-CM

## 2016-03-18 DIAGNOSIS — Z3A20 20 weeks gestation of pregnancy: Secondary | ICD-10-CM

## 2016-03-18 DIAGNOSIS — O321XX1 Maternal care for breech presentation, fetus 1: Secondary | ICD-10-CM | POA: Diagnosis not present

## 2016-03-18 DIAGNOSIS — Z1389 Encounter for screening for other disorder: Secondary | ICD-10-CM

## 2016-03-18 DIAGNOSIS — O09892 Supervision of other high risk pregnancies, second trimester: Secondary | ICD-10-CM

## 2016-03-18 DIAGNOSIS — R030 Elevated blood-pressure reading, without diagnosis of hypertension: Secondary | ICD-10-CM

## 2016-03-18 DIAGNOSIS — IMO0001 Reserved for inherently not codable concepts without codable children: Secondary | ICD-10-CM

## 2016-03-18 DIAGNOSIS — Z331 Pregnant state, incidental: Secondary | ICD-10-CM

## 2016-03-18 LAB — POCT URINALYSIS DIPSTICK
KETONES UA: NEGATIVE
Leukocytes, UA: NEGATIVE
Nitrite, UA: NEGATIVE
PROTEIN UA: NEGATIVE
RBC UA: NEGATIVE

## 2016-03-18 NOTE — Progress Notes (Signed)
US 19+4wks,breech,post pl g 0,normal ov's bilat,cx 4.5 cm,fhr 153 bpm,efw 326g,svp of fluid 5.7cm,limited view of heart,please have pt come back for additional images,no obvious abnormalities seen.

## 2016-03-18 NOTE — Progress Notes (Signed)
High Risk Pregnancy Diagnosis(es): AMA, likely CHTN- bp normal today [id number] Estimated Date of Delivery: 08/08/16 BP 138/80 mmHg  Pulse 84  Wt 235 lb (106.595 kg)  LMP 10/22/2015  Urinalysis: Positive for tr glucose HPI:  Some bp's at home are high but it is when bp cuff hurts arm- to check cuff to make sure fits properly- if not- order new size, could also be that those are times bp is high and cuff is having to pump up more to get accurate bp. 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  Reports good fm. Denies regular uc's, lof, vb, uti s/s. No complaints.  Fundal Height:  19wks Fetal Heart rate:  153 u/s Edema:  none  Reviewed today's anatomy u/s- limited views of heart- will recheck at next visit. Discussed warning s/s, fm.  All questions were answered Assessment: [id number] AMA, likely CHTN Medication(s) Plans:  Continue baby asa Treatment Plan:  U/S @ 20, 24, 28, 32, 36wks    2x/wk testing nst/sono @ 36wks    Deliver @ 40wks Follow up in 4wks for high-risk OB appt and efw/afi/recheck heart u/s

## 2016-03-18 NOTE — Patient Instructions (Addendum)
Wrist splint Bring your blood pressure log  [place] Pediatricians/Family Doctors:  [facility] [phone]            [facility] [phone]                 [facility] [phone] (usually not accepting new patients unless you have family there already, you are always welcome to call and ask)            [facility] ([address] ) [phone]   [facility]:   [facility]: [phone]  [facility]: [phone]   Second Trimester of Pregnancy The second trimester is from week 13 through week 28, months 4 through 6. The second trimester is often a time when you feel your best. Your body has also adjusted to being pregnant, and you begin to feel better physically. Usually, morning sickness has lessened or quit completely, you may have more energy, and you may have an increase in appetite. The second trimester is also a time when the fetus is growing rapidly. At the end of the sixth month, the fetus is about 9 inches long and weighs about 1 pounds. You will likely begin to feel the baby move (quickening) between 18 and 20 weeks of the pregnancy. BODY CHANGES Your body goes through many changes during pregnancy. The changes vary from woman to woman.   Your weight will continue to increase. You will notice your lower abdomen bulging out.  You may begin to get stretch marks on your hips, abdomen, and breasts.  You may develop headaches that can be relieved by medicines approved by your health care provider.  You may urinate more often because the fetus is pressing on your bladder.  You may develop or continue to have heartburn as a result of your pregnancy.  You may develop constipation because certain hormones are causing the muscles that push waste through your intestines to slow down.  You may develop hemorrhoids or swollen, bulging veins (varicose  veins).  You may have back pain because of the weight gain and pregnancy hormones relaxing your joints between the bones in your pelvis and as a result of a shift in weight and the muscles that support your balance.  Your breasts will continue to grow and be tender.  Your gums may bleed and may be sensitive to brushing and flossing.  Dark spots or blotches (chloasma, mask of pregnancy) may develop on your face. This will likely fade after the baby is born.  A dark line from your belly button to the pubic area (linea nigra) may appear. This will likely fade after the baby is born.  You may have changes in your hair. These can include thickening of your hair, rapid growth, and changes in texture. Some women also have hair loss during or after pregnancy, or hair that feels dry or thin. Your hair will most likely return to normal after your baby is born. WHAT TO EXPECT AT YOUR PRENATAL VISITS During a routine prenatal visit:  You will be weighed to make sure you and the fetus are growing normally.  Your blood pressure will be taken.  Your abdomen will be measured to track your baby's growth.  The fetal heartbeat will be listened to.  Any test results from the previous visit will be discussed. Your health care provider may ask you:  How you are feeling.  If you are feeling the baby move.  If you have had any  abnormal symptoms, such as leaking fluid, bleeding, severe headaches, or abdominal cramping.  If you are using any tobacco products, including cigarettes, chewing tobacco, and electronic cigarettes.  If you have any questions. Other tests that may be performed during your second trimester include:  Blood tests that check for:  Low iron levels (anemia).  Gestational diabetes (between 24 and 28 weeks).  Rh antibodies.  Urine tests to check for infections, diabetes, or protein in the urine.  An ultrasound to confirm the proper growth and development of the baby.  An  amniocentesis to check for possible genetic problems.  Fetal screens for spina bifida and Down syndrome.  HIV (human immunodeficiency virus) testing. Routine prenatal testing includes screening for HIV, unless you choose not to have this test. HOME CARE INSTRUCTIONS   Avoid all smoking, herbs, alcohol, and unprescribed drugs. These chemicals affect the formation and growth of the baby.  Do not use any tobacco products, including cigarettes, chewing tobacco, and electronic cigarettes. If you need help quitting, ask your health care provider. You may receive counseling support and other resources to help you quit.  Follow your health care provider's instructions regarding medicine use. There are medicines that are either safe or unsafe to take during pregnancy.  Exercise only as directed by your health care provider. Experiencing uterine cramps is a good sign to stop exercising.  Continue to eat regular, healthy meals.  Wear a good support bra for breast tenderness.  Do not use hot tubs, steam rooms, or saunas.  Wear your seat belt at all times when driving.  Avoid raw meat, uncooked cheese, cat litter boxes, and soil used by cats. These carry germs that can cause birth defects in the baby.  Take your prenatal vitamins.  Take 1500-2000 mg of calcium daily starting at the 20th week of pregnancy until you deliver your baby.  Try taking a stool softener (if your health care provider approves) if you develop constipation. Eat more high-fiber foods, such as fresh vegetables or fruit and whole grains. Drink plenty of fluids to keep your urine clear or pale yellow.  Take warm sitz baths to soothe any pain or discomfort caused by hemorrhoids. Use hemorrhoid cream if your health care provider approves.  If you develop varicose veins, wear support hose. Elevate your feet for 15 minutes, 3-4 times a day. Limit salt in your diet.  Avoid heavy lifting, wear low heel shoes, and practice good  posture.  Rest with your legs elevated if you have leg cramps or low back pain.  Visit your dentist if you have not gone yet during your pregnancy. Use a soft toothbrush to brush your teeth and be gentle when you floss.  A sexual relationship may be continued unless your health care provider directs you otherwise.  Continue to go to all your prenatal visits as directed by your health care provider. SEEK MEDICAL CARE IF:   You have dizziness.  You have mild pelvic cramps, pelvic pressure, or nagging pain in the abdominal area.  You have persistent nausea, vomiting, or diarrhea.  You have a bad smelling vaginal discharge.  You have pain with urination. SEEK IMMEDIATE MEDICAL CARE IF:   You have a fever.  You are leaking fluid from your vagina.  You have spotting or bleeding from your vagina.  You have severe abdominal cramping or pain.  You have rapid weight gain or loss.  You have shortness of breath with chest pain.  You notice sudden or extreme swelling of  your face, hands, ankles, feet, or legs.  You have not felt your baby move in over an hour.  You have severe headaches that do not go away with medicine.  You have vision changes.   This information is not intended to replace advice given to you by your health care provider. Make sure you discuss any questions you have with your health care provider.   Document Released: 11/12/2001 Document Revised: 12/09/2014 Document Reviewed: 01/19/2013 Elsevier Interactive Patient Education [employer]

## 2016-04-15 ENCOUNTER — Ambulatory Visit (INDEPENDENT_AMBULATORY_CARE_PROVIDER_SITE_OTHER): Payer: BLUE CROSS/BLUE SHIELD

## 2016-04-15 ENCOUNTER — Ambulatory Visit (INDEPENDENT_AMBULATORY_CARE_PROVIDER_SITE_OTHER): Payer: BLUE CROSS/BLUE SHIELD | Admitting: Women's Health

## 2016-04-15 VITALS — BP 142/80 | HR 84 | Wt 237.0 lb

## 2016-04-15 DIAGNOSIS — O09522 Supervision of elderly multigravida, second trimester: Secondary | ICD-10-CM

## 2016-04-15 DIAGNOSIS — O09892 Supervision of other high risk pregnancies, second trimester: Secondary | ICD-10-CM

## 2016-04-15 DIAGNOSIS — Z3A24 24 weeks gestation of pregnancy: Secondary | ICD-10-CM

## 2016-04-15 DIAGNOSIS — Z363 Encounter for antenatal screening for malformations: Secondary | ICD-10-CM

## 2016-04-15 DIAGNOSIS — Z331 Pregnant state, incidental: Secondary | ICD-10-CM

## 2016-04-15 DIAGNOSIS — O10912 Unspecified pre-existing hypertension complicating pregnancy, second trimester: Secondary | ICD-10-CM

## 2016-04-15 DIAGNOSIS — R030 Elevated blood-pressure reading, without diagnosis of hypertension: Secondary | ICD-10-CM

## 2016-04-15 DIAGNOSIS — O10919 Unspecified pre-existing hypertension complicating pregnancy, unspecified trimester: Secondary | ICD-10-CM

## 2016-04-15 DIAGNOSIS — Z1389 Encounter for screening for other disorder: Secondary | ICD-10-CM

## 2016-04-15 LAB — POCT URINALYSIS DIPSTICK
GLUCOSE UA: NEGATIVE
Ketones, UA: NEGATIVE
Leukocytes, UA: NEGATIVE
NITRITE UA: NEGATIVE
Protein, UA: NEGATIVE
RBC UA: NEGATIVE

## 2016-04-15 NOTE — Patient Instructions (Signed)
You will have your sugar test next visit.  Please do not eat or drink anything after midnight the night before you come, not even water.  You will be here for at least two hours.     Call the office ([phone]) or go to [facility] if:  You begin to have strong, frequent contractions  Your water breaks.  Sometimes it is a big gush of fluid, sometimes it is just a trickle that keeps getting your panties wet or running down your legs  You have vaginal bleeding.  It is normal to have a small amount of spotting if your cervix was checked.   You don't feel your baby moving like normal.  If you don't, get you something to eat and drink and lay down and focus on feeling your baby move.   If your baby is still not moving like normal, you should call the office or go to [facility].  Second Trimester of Pregnancy The second trimester is from week 13 through week 28, months 4 through 6. The second trimester is often a time when you feel your best. Your body has also adjusted to being pregnant, and you begin to feel better physically. Usually, morning sickness has lessened or quit completely, you may have more energy, and you may have an increase in appetite. The second trimester is also a time when the fetus is growing rapidly. At the end of the sixth month, the fetus is about 9 inches long and weighs about 1 pounds. You will likely begin to feel the baby move (quickening) between 18 and 20 weeks of the pregnancy. BODY CHANGES Your body goes through many changes during pregnancy. The changes vary from woman to woman.   Your weight will continue to increase. You will notice your lower abdomen bulging out.  You may begin to get stretch marks on your hips, abdomen, and breasts.  You may develop headaches that can be relieved by medicines approved by your health care provider.  You may urinate more often because the fetus is pressing on your bladder.  You may develop or continue to have  heartburn as a result of your pregnancy.  You may develop constipation because certain hormones are causing the muscles that push waste through your intestines to slow down.  You may develop hemorrhoids or swollen, bulging veins (varicose veins).  You may have back pain because of the weight gain and pregnancy hormones relaxing your joints between the bones in your pelvis and as a result of a shift in weight and the muscles that support your balance.  Your breasts will continue to grow and be tender.  Your gums may bleed and may be sensitive to brushing and flossing.  Dark spots or blotches (chloasma, mask of pregnancy) may develop on your face. This will likely fade after the baby is born.  A dark line from your belly button to the pubic area (linea nigra) may appear. This will likely fade after the baby is born.  You may have changes in your hair. These can include thickening of your hair, rapid growth, and changes in texture. Some women also have hair loss during or after pregnancy, or hair that feels dry or thin. Your hair will most likely return to normal after your baby is born. WHAT TO EXPECT AT YOUR PRENATAL VISITS During a routine prenatal visit:  You will be weighed to make sure you and the fetus are growing normally.  Your blood pressure will be taken.    Your abdomen will be measured to track your baby's growth.  The fetal heartbeat will be listened to.  Any test results from the previous visit will be discussed. Your health care provider may ask you:  How you are feeling.  If you are feeling the baby move.  If you have had any abnormal symptoms, such as leaking fluid, bleeding, severe headaches, or abdominal cramping.  If you have any questions. Other tests that may be performed during your second trimester include:  Blood tests that check for:  Low iron levels (anemia).  Gestational diabetes (between 24 and 28 weeks).  Rh antibodies.  Urine tests to check  for infections, diabetes, or protein in the urine.  An ultrasound to confirm the proper growth and development of the baby.  An amniocentesis to check for possible genetic problems.  Fetal screens for spina bifida and Down syndrome. HOME CARE INSTRUCTIONS   Avoid all smoking, herbs, alcohol, and unprescribed drugs. These chemicals affect the formation and growth of the baby.  Follow your health care provider's instructions regarding medicine use. There are medicines that are either safe or unsafe to take during pregnancy.  Exercise only as directed by your health care provider. Experiencing uterine cramps is a good sign to stop exercising.  Continue to eat regular, healthy meals.  Wear a good support bra for breast tenderness.  Do not use hot tubs, steam rooms, or saunas.  Wear your seat belt at all times when driving.  Avoid raw meat, uncooked cheese, cat litter boxes, and soil used by cats. These carry germs that can cause birth defects in the baby.  Take your prenatal vitamins.  Try taking a stool softener (if your health care provider approves) if you develop constipation. Eat more high-fiber foods, such as fresh vegetables or fruit and whole grains. Drink plenty of fluids to keep your urine clear or pale yellow.  Take warm sitz baths to soothe any pain or discomfort caused by hemorrhoids. Use hemorrhoid cream if your health care provider approves.  If you develop varicose veins, wear support hose. Elevate your feet for 15 minutes, 3-4 times a day. Limit salt in your diet.  Avoid heavy lifting, wear low heel shoes, and practice good posture.  Rest with your legs elevated if you have leg cramps or low back pain.  Visit your dentist if you have not gone yet during your pregnancy. Use a soft toothbrush to brush your teeth and be gentle when you floss.  A sexual relationship may be continued unless your health care provider directs you otherwise.  Continue to go to all your  prenatal visits as directed by your health care provider. SEEK MEDICAL CARE IF:   You have dizziness.  You have mild pelvic cramps, pelvic pressure, or nagging pain in the abdominal area.  You have persistent nausea, vomiting, or diarrhea.  You have a bad smelling vaginal discharge.  You have pain with urination. SEEK IMMEDIATE MEDICAL CARE IF:   You have a fever.  You are leaking fluid from your vagina.  You have spotting or bleeding from your vagina.  You have severe abdominal cramping or pain.  You have rapid weight gain or loss.  You have shortness of breath with chest pain.  You notice sudden or extreme swelling of your face, hands, ankles, feet, or legs.  You have not felt your baby move in over an hour.  You have severe headaches that do not go away with medicine.  You have vision changes.   Document Released: 11/12/2001 Document Revised: 11/23/2013 Document Reviewed: 01/19/2013 Exit[employer] Patient Information 2015 [employer]. This information is not intended to replace advice given to you by your health care provider. Make sure you discuss any questions you have with your health care provider.     

## 2016-04-15 NOTE — Progress Notes (Signed)
US 23+4 wks,measurements c/w dates,post pl gr 0,normal ov's bilat,cephalic,heart anatomy complete,no obvious abn seen,fhr 128 bpm,cx 4 cm,svp of fluid 6.4cm,efw 657 g

## 2016-04-15 NOTE — Progress Notes (Signed)
High Risk Pregnancy Diagnosis(es): CHTN-dx today, AMA G8P0070 553w4d Estimated Date of Delivery: 08/08/16 BP 142/80 mmHg  Pulse 84  Wt 237 lb (107.502 kg)  LMP 10/22/2015  Urinalysis: Negative HPI:  Doing well, nipples burn when they get cold, small new mole on Lt areola- no itching/flaking BP, weight, and urine reviewed.  Reports good fm. Denies regular uc's, lof, vb, uti s/s.   Fundal Height:  24 Fetal Heart rate:  128 u/s Edema:  None Small light brown round symmetrical slightly raised area/possible nevi Lt areola- no suspicious findings, likely d/t hormonal changes, to keep an eye out for any changes Nipples appear normal- discussed breast changes during pregnancy  Reviewed ptl s/s, fm. Discussed normal u/s today for f/u heart anatomy All questions were answered Assessment: [redacted]w[redacted]d CHTN, AMA Medication(s) Plans:  Continue baby asa daily, no need for bp meds currently Treatment Plan: Will get baseline 24hr urine and CMP today, Growth u/s @ 28, 32, 36wks (per AMA)    2x/wk testing nst/sono @ 32wks    Deliver @ 40wks (no meds), 39wks (meds) Follow up in 4wks for high-risk OB appt, pn2, and efw/afi u/s CMP, 24hr urine today

## 2016-04-18 LAB — COMPREHENSIVE METABOLIC PANEL
A/G RATIO: 1.4 (ref 1.2–2.2)
ALK PHOS: 89 IU/L (ref 39–117)
ALT: 10 IU/L (ref 0–32)
AST: 12 IU/L (ref 0–40)
Albumin: 3.6 g/dL (ref 3.5–5.5)
BILIRUBIN TOTAL: 0.3 mg/dL (ref 0.0–1.2)
BUN / CREAT RATIO: 13 (ref 9–23)
BUN: 7 mg/dL (ref 6–24)
CHLORIDE: 102 mmol/L (ref 96–106)
CO2: 22 mmol/L (ref 18–29)
Calcium: 9.5 mg/dL (ref 8.7–10.2)
Creatinine, Ser: 0.54 mg/dL — ABNORMAL LOW (ref 0.57–1.00)
GFR calc Af Amer: 136 mL/min/{1.73_m2} (ref >59)
GFR calc non Af Amer: 118 mL/min/{1.73_m2} (ref >59)
GLUCOSE: 160 mg/dL — AB (ref 65–99)
Globulin, Total: 2.5 g/dL (ref 1.5–4.5)
POTASSIUM: 4 mmol/L (ref 3.5–5.2)
Sodium: 141 mmol/L (ref 134–144)
Total Protein: 6.1 g/dL (ref 6.0–8.5)

## 2016-04-18 LAB — PROTEIN, URINE, 24 HOUR
PROTEIN 24H UR: 134 mg/(24.h) (ref 30–150)
PROTEIN UR: 10.7 mg/dL

## 2016-04-26 ENCOUNTER — Telehealth: Payer: Self-pay | Admitting: *Deleted

## 2016-04-26 NOTE — Telephone Encounter (Signed)
Spoke with pt. Pt has been coughing and had runny nose. She is having sharpe pains in stomach and sides when coughing. + pressure. + baby movement. No provider in office. [facility] Pt voiced understanding. JSY

## 2016-05-10 ENCOUNTER — Other Ambulatory Visit: Payer: Self-pay | Admitting: Women's Health

## 2016-05-10 DIAGNOSIS — O09522 Supervision of elderly multigravida, second trimester: Secondary | ICD-10-CM

## 2016-05-13 ENCOUNTER — Ambulatory Visit (INDEPENDENT_AMBULATORY_CARE_PROVIDER_SITE_OTHER): Payer: BLUE CROSS/BLUE SHIELD

## 2016-05-13 ENCOUNTER — Telehealth: Payer: Self-pay | Admitting: Obstetrics & Gynecology

## 2016-05-13 ENCOUNTER — Encounter: Payer: Self-pay | Admitting: Obstetrics and Gynecology

## 2016-05-13 ENCOUNTER — Other Ambulatory Visit: Payer: BLUE CROSS/BLUE SHIELD

## 2016-05-13 ENCOUNTER — Ambulatory Visit (INDEPENDENT_AMBULATORY_CARE_PROVIDER_SITE_OTHER): Payer: BLUE CROSS/BLUE SHIELD | Admitting: Obstetrics and Gynecology

## 2016-05-13 VITALS — BP 130/80 | HR 91 | Wt 238.0 lb

## 2016-05-13 DIAGNOSIS — Z131 Encounter for screening for diabetes mellitus: Secondary | ICD-10-CM

## 2016-05-13 DIAGNOSIS — O09893 Supervision of other high risk pregnancies, third trimester: Secondary | ICD-10-CM

## 2016-05-13 DIAGNOSIS — Z3A28 28 weeks gestation of pregnancy: Secondary | ICD-10-CM | POA: Diagnosis not present

## 2016-05-13 DIAGNOSIS — Z369 Encounter for antenatal screening, unspecified: Secondary | ICD-10-CM

## 2016-05-13 DIAGNOSIS — O321XX1 Maternal care for breech presentation, fetus 1: Secondary | ICD-10-CM | POA: Diagnosis not present

## 2016-05-13 DIAGNOSIS — Z1389 Encounter for screening for other disorder: Secondary | ICD-10-CM

## 2016-05-13 DIAGNOSIS — Z331 Pregnant state, incidental: Secondary | ICD-10-CM

## 2016-05-13 DIAGNOSIS — O09523 Supervision of elderly multigravida, third trimester: Secondary | ICD-10-CM | POA: Diagnosis not present

## 2016-05-13 DIAGNOSIS — O09892 Supervision of other high risk pregnancies, second trimester: Secondary | ICD-10-CM

## 2016-05-13 DIAGNOSIS — O10919 Unspecified pre-existing hypertension complicating pregnancy, unspecified trimester: Secondary | ICD-10-CM

## 2016-05-13 DIAGNOSIS — O09522 Supervision of elderly multigravida, second trimester: Secondary | ICD-10-CM

## 2016-05-13 LAB — POCT URINALYSIS DIPSTICK
GLUCOSE UA: NEGATIVE
Ketones, UA: NEGATIVE
Leukocytes, UA: NEGATIVE
NITRITE UA: NEGATIVE
Protein, UA: NEGATIVE
RBC UA: NEGATIVE

## 2016-05-13 NOTE — Progress Notes (Signed)
Pt denies any problems or concerns at this time.  

## 2016-05-13 NOTE — Telephone Encounter (Signed)
Pt states she was here this am for Glucose tolerance test and to see Dr.Ferguson, after she left here she had an episode of blurred vision, not feeling well. Pt states since she called she has ate and rested and is feeling much better. Pt informed my have had the episode of dizziness due to not having eat anything since last night. Pt to call our office back if episodes reoccur.

## 2016-05-13 NOTE — Progress Notes (Signed)
Patient ID: Lucy ChrisJoan Cheeks, female   DOB: 1976-01-14, 40 y.o.   MRN: [id number]   High Risk Pregnancy Diagnosis(es):   Dione HousekeeperCHTN, AMA  [id number] 6437w4d Estimated Date of Delivery: 08/08/16    HPI: The patient is being seen today for ongoing management of CHTN and AMA. Today she reports ocassional BL ankle swelling, worsened with prolonged periods of standing. Pt states she plans on attending childbirth classes. Patient reports good fetal movement, denies any bleeding, abnormal discharge and no rupture of membranes symptoms or regular contractions.   BP weight and urine results reviewed and noted. Blood pressure 130/80, pulse 91, weight 238 lb (107.956 kg), last menstrual period 10/22/2015.  Fetal Surveillance Testing today:  pn2, and efw/afi u/s Fundal Height:  27 cm Fetal Heart rate:  143 bpm Edema:  none Urinalysis: Negative   Questions were answered.  Lab and sonogram results have been reviewed. Comments: normal  Assessment:  1.  Pregnancy at 7937w4d,  Estimated Date of Delivery: 08/08/16 :  G8P0070                        2.  AMA and CHTN                     Medication(s) Plans:  Continue baby asa daily, no need for bp meds currently  Treatment Plan:  Growth u/s @ 32, 36wks (per AMA) 2x/wk testing nst/sono @ 32wks Deliver @ 40wks (no meds), 39wks (meds)  Follow up in 4 weeks for high-risk OB appointment for high risk OB care  By signing my name below, I, Marisue HumbleMichelle Chaffee, attest that this documentation has been prepared under the direction and in the presence of Tilda BurrowJohn V Darianna Amy, MD . Electronically Signed: Marisue HumbleMichelle Chaffee, Scribe. 05/13/2016. 10:47 AM.  I personally performed the services described in this documentation, which was SCRIBED in my presence. The recorded information has been reviewed and considered accurate. It has been edited as necessary during review. Tilda BurrowFERGUSON,Cordera Stineman V, MD

## 2016-05-13 NOTE — Progress Notes (Signed)
US 27+4 wks,breech,cx 3.7 cm,normal ov's bilat,post pl gr 1,fhe 151 bpm,afi 13 cm,efw 1120 g 54%

## 2016-05-14 ENCOUNTER — Other Ambulatory Visit: Payer: Self-pay | Admitting: Women's Health

## 2016-05-14 ENCOUNTER — Encounter: Payer: Self-pay | Admitting: Women's Health

## 2016-05-14 ENCOUNTER — Telehealth: Payer: Self-pay | Admitting: *Deleted

## 2016-05-14 DIAGNOSIS — O24419 Gestational diabetes mellitus in pregnancy, unspecified control: Secondary | ICD-10-CM | POA: Insufficient documentation

## 2016-05-14 DIAGNOSIS — O24410 Gestational diabetes mellitus in pregnancy, diet controlled: Secondary | ICD-10-CM

## 2016-05-14 LAB — CBC
HEMATOCRIT: 36.6 % (ref 34.0–46.6)
Hemoglobin: 13.1 g/dL (ref 11.1–15.9)
MCH: 33.2 pg — ABNORMAL HIGH (ref 26.6–33.0)
MCHC: 35.8 g/dL — AB (ref 31.5–35.7)
MCV: 93 fL (ref 79–97)
Platelets: 301 10*3/uL (ref 150–379)
RBC: 3.94 x10E6/uL (ref 3.77–5.28)
RDW: 12.9 % (ref 12.3–15.4)
WBC: 11.9 10*3/uL — AB (ref 3.4–10.8)

## 2016-05-14 LAB — GLUCOSE TOLERANCE, 2 HOURS W/ 1HR
GLUCOSE, FASTING: 102 mg/dL — AB (ref 65–91)
Glucose, 1 hour: 224 mg/dL — ABNORMAL HIGH (ref 65–179)
Glucose, 2 hour: 139 mg/dL (ref 65–152)

## 2016-05-14 LAB — HIV ANTIBODY (ROUTINE TESTING W REFLEX): HIV SCREEN 4TH GENERATION: NONREACTIVE

## 2016-05-14 LAB — ANTIBODY SCREEN: ANTIBODY SCREEN: NEGATIVE

## 2016-05-14 LAB — RPR: RPR: NONREACTIVE

## 2016-05-14 NOTE — Telephone Encounter (Signed)
Pt informed abnormal Glucose Tolerance Test results, Dx of Gestational Diabetes, referral for Nutritionist, bring log with her to each appt. If pt does not here from nutritionist within 1 weeks call our office. Pt verbalized understanding.

## 2016-05-20 ENCOUNTER — Telehealth: Payer: Self-pay | Admitting: Women's Health

## 2016-05-21 NOTE — Telephone Encounter (Signed)
I spoke with Melissa @ [facility] and she is going to call the patient as soon as we hang up the phone and get her scheduled.

## 2016-05-22 ENCOUNTER — Encounter: Payer: BLUE CROSS/BLUE SHIELD | Attending: Obstetrics and Gynecology | Admitting: Skilled Nursing Facility1

## 2016-05-22 VITALS — Ht 65.0 in | Wt 240.0 lb

## 2016-05-22 DIAGNOSIS — O24410 Gestational diabetes mellitus in pregnancy, diet controlled: Secondary | ICD-10-CM | POA: Insufficient documentation

## 2016-05-23 ENCOUNTER — Encounter: Payer: Self-pay | Admitting: Skilled Nursing Facility1

## 2016-05-23 ENCOUNTER — Telehealth: Payer: Self-pay | Admitting: Advanced Practice Midwife

## 2016-05-23 NOTE — Telephone Encounter (Signed)
Test strips and lancets called to pt's pharmacy.

## 2016-05-23 NOTE — Progress Notes (Signed)
  Patient was seen on 05/22/2016 for Gestational Diabetes self-management class at the [facility]. The following learning objectives were met by the patient during this course:   States the definition of Gestational Diabetes  States why dietary management is important in controlling blood glucose  Describes the effects each nutrient has on blood glucose levels  Demonstrates ability to create a balanced meal plan  Demonstrates carbohydrate counting   States when to check blood glucose levels involving a total of 4 separate occurences in a day  Demonstrates proper blood glucose monitoring techniques  States the effect of stress and exercise on blood glucose levels  States the importance of limiting caffeine and abstaining from alcohol and smoking  Demonstrates the knowledge the glucometer provided in class may not be covered by their insurance and to call their insurance provider immediately after class to know which glucometer their insurance provider does cover as well as calling their physician the next day for a prescription to the glucometer their insurance does cover (if the one provided is not) as well as the lancets and strips for that meter.  Blood glucose monitor given: one touch verio flex Lot # [id number] Exp: 07/01/2017 Blood glucose reading: 99  Patient instructed to monitor glucose levels: FBS: 60 - <90 1 hour: <140 2 hour: <120  *Patient received handouts:  Nutrition Diabetes and Pregnancy  Carbohydrate Counting List  Patient will be seen for follow-up as needed.   

## 2016-05-23 NOTE — Telephone Encounter (Signed)
Pt called stating that she needs test strips and lancets for a One touch Verio machine and she checks her sugars four times a day.

## 2016-06-09 ENCOUNTER — Inpatient Hospital Stay (HOSPITAL_COMMUNITY)
Admission: AD | Admit: 2016-06-09 | Discharge: 2016-06-09 | Disposition: A | Discharge status: Home / Self Care | Payer: BLUE CROSS/BLUE SHIELD | Source: Ambulatory Visit | Attending: Obstetrics & Gynecology | Admitting: Obstetrics & Gynecology

## 2016-06-09 ENCOUNTER — Encounter (HOSPITAL_COMMUNITY): Payer: Self-pay | Admitting: *Deleted

## 2016-06-09 DIAGNOSIS — Z3A31 31 weeks gestation of pregnancy: Secondary | ICD-10-CM | POA: Insufficient documentation

## 2016-06-09 DIAGNOSIS — O99353 Diseases of the nervous system complicating pregnancy, third trimester: Secondary | ICD-10-CM | POA: Insufficient documentation

## 2016-06-09 DIAGNOSIS — O26893 Other specified pregnancy related conditions, third trimester: Secondary | ICD-10-CM | POA: Diagnosis not present

## 2016-06-09 DIAGNOSIS — G43809 Other migraine, not intractable, without status migrainosus: Secondary | ICD-10-CM | POA: Diagnosis not present

## 2016-06-09 DIAGNOSIS — O09523 Supervision of elderly multigravida, third trimester: Secondary | ICD-10-CM | POA: Insufficient documentation

## 2016-06-09 DIAGNOSIS — O99333 Smoking (tobacco) complicating pregnancy, third trimester: Secondary | ICD-10-CM | POA: Insufficient documentation

## 2016-06-09 DIAGNOSIS — H538 Other visual disturbances: Secondary | ICD-10-CM | POA: Insufficient documentation

## 2016-06-09 HISTORY — DX: Gestational diabetes mellitus in pregnancy, unspecified control: O24.419

## 2016-06-09 LAB — PROTEIN / CREATININE RATIO, URINE
CREATININE, URINE: 277 mg/dL
Protein Creatinine Ratio: 0.14 mg/mg{Cre} (ref 0.00–0.15)
TOTAL PROTEIN, URINE: 40 mg/dL

## 2016-06-09 LAB — URINALYSIS, ROUTINE W REFLEX MICROSCOPIC
BILIRUBIN URINE: NEGATIVE
Glucose, UA: NEGATIVE mg/dL
HGB URINE DIPSTICK: NEGATIVE
Ketones, ur: 15 mg/dL — AB
LEUKOCYTES UA: NEGATIVE
Nitrite: NEGATIVE
PH: 6.5 (ref 5.0–8.0)
Protein, ur: 30 mg/dL — AB
Specific Gravity, Urine: 1.02 (ref 1.005–1.030)

## 2016-06-09 LAB — CBC
HCT: 37.4 % (ref 36.0–46.0)
Hemoglobin: 13.3 g/dL (ref 12.0–15.0)
MCH: 32.4 pg (ref 26.0–34.0)
MCHC: 35.6 g/dL (ref 30.0–36.0)
MCV: 91.2 fL (ref 78.0–100.0)
PLATELETS: 278 10*3/uL (ref 150–400)
RBC: 4.1 MIL/uL (ref 3.87–5.11)
RDW: 13.2 % (ref 11.5–15.5)
WBC: 11.8 10*3/uL — AB (ref 4.0–10.5)

## 2016-06-09 LAB — COMPREHENSIVE METABOLIC PANEL
ALT: 11 U/L — ABNORMAL LOW (ref 14–54)
AST: 15 U/L (ref 15–41)
Albumin: 2.9 g/dL — ABNORMAL LOW (ref 3.5–5.0)
Alkaline Phosphatase: 111 U/L (ref 38–126)
Anion gap: 7 (ref 5–15)
BUN: 10 mg/dL (ref 6–20)
CHLORIDE: 105 mmol/L (ref 101–111)
CO2: 24 mmol/L (ref 22–32)
Calcium: 8.8 mg/dL — ABNORMAL LOW (ref 8.9–10.3)
Creatinine, Ser: 0.66 mg/dL (ref 0.44–1.00)
Glucose, Bld: 113 mg/dL — ABNORMAL HIGH (ref 65–99)
POTASSIUM: 3.6 mmol/L (ref 3.5–5.1)
SODIUM: 136 mmol/L (ref 135–145)
Total Bilirubin: 0.3 mg/dL (ref 0.3–1.2)
Total Protein: 6.8 g/dL (ref 6.5–8.1)

## 2016-06-09 LAB — URINE MICROSCOPIC-ADD ON

## 2016-06-09 NOTE — MAU Provider Note (Signed)
History   G8P0070 @ 31.3 wks in with Pt states she is having blurred vision. It has been happening on and off all week but happened four times yesterday and once today but today's lasted over one hour. 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 Pt denies headaches with these vision changes.  CSN: [id number]  Arrival date & time 06/09/16  1419   None     Chief Complaint  Patient presents with  . Blurred Vision    HPI  Past Medical History  Diagnosis Date  . 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 Pregnant 12/06/2015  . 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 AMA (advanced maternal age) multigravida 35+ 12/06/2015  . Smoker 12/06/2015  . History of HPV infection   . Gestational diabetes     Past Surgical History  Procedure Laterality Date  . Thyroid surgery      Family History  Problem Relation Age of Onset  . Diabetes Mother   . Stroke Mother   . Thyroid disease Mother   . Diabetes Father   . Hyperlipidemia Father   . Diabetes Maternal Grandmother   . Cancer Maternal Grandmother     stomach  . Diabetes Paternal Grandmother   . Heart attack Paternal Grandfather     Social History  Substance Use Topics  . Smoking status: Current Every Day Smoker -- 0.50 packs/day for 26 years    Types: Cigarettes  . Smokeless tobacco: Never Used  . Alcohol Use: No     Comment: not now    OB History    Gravida Para Term Preterm AB TAB SAB Ectopic Multiple Living   8    7 1 6    0      Review of Systems  Constitutional: Negative.   HENT: Negative.   Eyes: Positive for visual disturbance.  Respiratory: Negative.   Cardiovascular: Negative.   Gastrointestinal: Negative.   Endocrine: Negative.   Genitourinary: Negative.   Musculoskeletal: Negative.   Skin: Negative.   Allergic/Immunologic: Negative.   Neurological: Negative.   Hematological: Negative.   Psychiatric/Behavioral: Negative.     Allergies  Other  Home Medications  No current outpatient  prescriptions on file.  BP 125/81 mmHg  Pulse 94  Temp(Src) 98.4 F (36.9 C) (Oral)  Resp 18  LMP 10/22/2015  Physical Exam  Constitutional: She is oriented to person, place, and time. She appears well-developed and well-nourished.  HENT:  Head: Normocephalic.  Eyes: Pupils are equal, round, and reactive to light.  Cardiovascular: Normal rate, regular rhythm, normal heart sounds and intact distal pulses.   Pulmonary/Chest: Effort normal and breath sounds normal.  Abdominal: Soft. Bowel sounds are normal.  Genitourinary: Vagina normal and uterus normal.  Musculoskeletal: Normal range of motion.  Neurological: She is alert and oriented to person, place, and time. She has normal reflexes.  Skin: Skin is warm and dry.  Psychiatric: She has a normal mood and affect. Her behavior is normal. Judgment and thought content normal.    MAU Course  Procedures (including critical care time)  Labs Reviewed  URINALYSIS, ROUTINE W REFLEX MICROSCOPIC (NOT [facility])  CBC  COMPREHENSIVE METABOLIC PANEL  PROTEIN / CREATININE RATIO, URINE   No results found.   No diagnosis found.    MDM  Dx: Blurred vision,  Hx of ocular migraine. VSS, Labs WNL, FHR pattern reassuring. No uc's. Will d/c [facility]

## 2016-06-09 NOTE — MAU Note (Signed)
Pt states she is having blurred vision.  It has been happening on and off all week but happened four times yesterday and once today but today's lasted over one hour.  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  Pt denies headaches with these vision changes.

## 2016-06-09 NOTE — Progress Notes (Signed)
Susan Reeves. [name], CNM notified that results are back.  States she will look them over when she finishes with a delivery.

## 2016-06-10 ENCOUNTER — Ambulatory Visit (INDEPENDENT_AMBULATORY_CARE_PROVIDER_SITE_OTHER): Payer: BLUE CROSS/BLUE SHIELD | Admitting: Women's Health

## 2016-06-10 ENCOUNTER — Encounter: Payer: Self-pay | Admitting: Women's Health

## 2016-06-10 VITALS — BP 144/88 | HR 74 | Wt 235.0 lb

## 2016-06-10 DIAGNOSIS — O10919 Unspecified pre-existing hypertension complicating pregnancy, unspecified trimester: Secondary | ICD-10-CM

## 2016-06-10 DIAGNOSIS — O09893 Supervision of other high risk pregnancies, third trimester: Secondary | ICD-10-CM

## 2016-06-10 DIAGNOSIS — O24419 Gestational diabetes mellitus in pregnancy, unspecified control: Secondary | ICD-10-CM

## 2016-06-10 DIAGNOSIS — Z3A32 32 weeks gestation of pregnancy: Secondary | ICD-10-CM

## 2016-06-10 DIAGNOSIS — O09523 Supervision of elderly multigravida, third trimester: Secondary | ICD-10-CM

## 2016-06-10 DIAGNOSIS — Z331 Pregnant state, incidental: Secondary | ICD-10-CM

## 2016-06-10 DIAGNOSIS — Z1389 Encounter for screening for other disorder: Secondary | ICD-10-CM

## 2016-06-10 DIAGNOSIS — O10913 Unspecified pre-existing hypertension complicating pregnancy, third trimester: Secondary | ICD-10-CM

## 2016-06-10 LAB — POCT URINALYSIS DIPSTICK
Blood, UA: NEGATIVE
GLUCOSE UA: NEGATIVE
Ketones, UA: NEGATIVE
LEUKOCYTES UA: NEGATIVE
NITRITE UA: NEGATIVE
Protein, UA: NEGATIVE

## 2016-06-10 MED ORDER — LABETALOL HCL 200 MG PO TABS: 200.0000 mg | ORAL_TABLET | Freq: Two times a day (BID) | ORAL | Status: DC

## 2016-06-10 MED ORDER — GLYBURIDE 2.5 MG PO TABS: 2.5000 mg | ORAL_TABLET | Freq: Two times a day (BID) | ORAL | Status: DC

## 2016-06-10 NOTE — Progress Notes (Signed)
High Risk Pregnancy Diagnosis(es): CHTN-no meds, AMA, A1DM G8P0070 [redacted]w[redacted]d Estimated Date of Del45ivery: 08/08/16 BP 162/88 mmHg  Pulse 74  Wt 235 lb (106.595 kg)  LMP 10/22/2015  Urinalysis: Negative HPI:   FBS 94-115 (all >90), 2hr pp 95-158, is really counting carbs and trying to do good w/ diet, has lost 3lbs since last visit. [facility]/ [facility], pre-e labs and bp were normal. Does have h/o ocular migraines, although w/ this blurred vision she is not getting the migraines. Had ha after she left hospital yesterday, but not bad and went away w/ apap. Denies ruq/epigastric pain, n/v.  Discussed bp today- and recommended starting on labetalol- pt hesitant to start today- states bp was good yesterday at [facility] (125/80)- 162/88 and 144/88 today- sent rx for labetalol, but per pt preference can wait to see what bp is on thus before beginning. States she drank french roast coffee before coming and maybe that's why its higher. Still smoking also.  BP, weight, and urine reviewed.  Reports good fm. Denies regular uc's, lof, vb, uti s/s.   Fundal Height:  31 Fetal Heart rate:  145 Edema:  None DTRs 2+, no clonus  Reviewed ptl s/s, fkc, pre-e s/s. Recommended Tdap at HD/PCP per CDC guidelines.  All questions were answered Assessment: 3053w4d CHTN, now A2DM, AMA, smoker Medication(s) Plans:  Start glyburide 2.5mg  BID, rx'd labetalol 200mg  bid-pt prefers to wait to recheck bp on thurs before beginning, continue baby asa Treatment Plan:  Growth U/S @ 32, 35, 38wks    2x/wk testing nst/sono @ 32wks     Deliver 39wks or earlier if clinically indicated Follow up in 3d for high-risk OB appt and bpp/dopp/efw/afi u/s and to discuss if labetalol needed

## 2016-06-10 NOTE — Patient Instructions (Addendum)
Call the office [phone]) or go to [facility] if:  You begin to have strong, frequent contractions  Your water breaks.  Sometimes it is a big gush of fluid, sometimes it is just a trickle that keeps getting your panties wet or running down your legs  You have vaginal bleeding.  It is normal to have a small amount of spotting if your cervix was checked.   You don't feel your baby moving like normal.  If you don't, get you something to eat and drink and lay down and focus on feeling your baby move.  You should feel at least 10 movements in 2 hours.  If you don't, you should call the office or go to [facility].    Call the office [phone]) or go to [facility] hospital for these signs of pre-eclampsia:  Severe headache that does not go away with Tylenol  Visual changes- seeing spots, double, blurred vision  Pain under your right breast or upper abdomen that does not go away with Tums or heartburn medicine  Nausea and/or vomiting Severe swelling in your hands, feet, and face   Tdap Vaccine  It is recommended that you get the Tdap vaccine during the third trimester of EACH pregnancy to help protect your baby from getting pertussis (whooping cough)  27-36 weeks is the BEST time to do this so that you can pass the protection on to your baby. During pregnancy is better than after pregnancy, but if you are unable to get it during pregnancy it will be offered at the hospital.   You can get this vaccine at the health department or your family doctor  Everyone who will be around your baby should also be up-to-date on their vaccines. Adults (who are not pregnant) only need 1 dose of Tdap during adulthood.      Preterm Labor Information Preterm labor is when labor starts at less than 37 weeks of pregnancy. The normal length of a pregnancy is 39 to 41 weeks. CAUSES Often, there is no identifiable underlying cause as to why a woman goes into preterm labor. One of the most common known  causes of preterm labor is infection. Infections of the uterus, cervix, vagina, amniotic sac, bladder, kidney, or even the lungs (pneumonia) can cause labor to start. Other suspected causes of preterm labor include:   Urogenital infections, such as yeast infections and bacterial vaginosis.   Uterine abnormalities (uterine shape, uterine septum, fibroids, or bleeding from the placenta).   A cervix that has been operated on (it may fail to stay closed).   Malformations in the fetus.   Multiple gestations (twins, triplets, and so on).   Breakage of the amniotic sac.  RISK FACTORS  Having a previous history of preterm labor.   Having premature rupture of membranes (PROM).   Having a placenta that covers the opening of the cervix (placenta previa).   Having a placenta that separates from the uterus (placental abruption).   Having a cervix that is too weak to hold the fetus in the uterus (incompetent cervix).   Having too much fluid in the amniotic sac (polyhydramnios).   Taking illegal drugs or smoking while pregnant.   Not gaining enough weight while pregnant.   Being younger than 31 and older than 41 years old.   Having a low socioeconomic status.   Being African American. SYMPTOMS Signs and symptoms of preterm labor include:   Menstrual-like cramps, abdominal pain, or back pain.  Uterine contractions that are regular, as  frequent as six in an hour, regardless of their intensity (may be mild or painful).  Contractions that start on the top of the uterus and spread down to the lower abdomen and back.   A sense of increased pelvic pressure.   A watery or bloody mucus discharge that comes from the vagina.  TREATMENT Depending on the length of the pregnancy and other circumstances, your health care provider may suggest bed rest. If necessary, there are medicines that can be given to stop contractions and to mature the fetal lungs. If labor happens before  34 weeks of pregnancy, a prolonged hospital stay may be recommended. Treatment depends on the condition of both you and the fetus.  WHAT SHOULD YOU DO IF YOU THINK YOU ARE IN PRETERM LABOR? Call your health care provider right away. You will need to go to the hospital to get checked immediately. HOW CAN YOU PREVENT PRETERM LABOR IN FUTURE PREGNANCIES? You should:   Stop smoking if you smoke.  Maintain healthy weight gain and avoid chemicals and drugs that are not necessary.  Be watchful for any type of infection.  Inform your health care provider if you have a known history of preterm labor.   This information is not intended to replace advice given to you by your health care provider. Make sure you discuss any questions you have with your health care provider.   Document Released: 02/08/2004 Document Revised: 07/21/2013 Document Reviewed: 12/21/2012 Elsevier Interactive Patient Education [employer]2016 [employer]

## 2016-06-11 ENCOUNTER — Telehealth: Payer: Self-pay | Admitting: Women's Health

## 2016-06-11 NOTE — Telephone Encounter (Signed)
Pt advised per Joellyn HaffKim Booker, CNM, not aware of nipple pain as a side effect of Labetalol or Glyburide. Offered pt an appt, pt states has an appt scheduled for this Thursday and would wait to be seen then.

## 2016-06-13 ENCOUNTER — Ambulatory Visit (INDEPENDENT_AMBULATORY_CARE_PROVIDER_SITE_OTHER): Payer: BLUE CROSS/BLUE SHIELD | Admitting: Obstetrics and Gynecology

## 2016-06-13 ENCOUNTER — Encounter: Payer: Self-pay | Admitting: Obstetrics and Gynecology

## 2016-06-13 ENCOUNTER — Ambulatory Visit (INDEPENDENT_AMBULATORY_CARE_PROVIDER_SITE_OTHER): Payer: BLUE CROSS/BLUE SHIELD

## 2016-06-13 VITALS — BP 150/80 | HR 94 | Wt 237.0 lb

## 2016-06-13 DIAGNOSIS — O09523 Supervision of elderly multigravida, third trimester: Secondary | ICD-10-CM

## 2016-06-13 DIAGNOSIS — Z3A32 32 weeks gestation of pregnancy: Secondary | ICD-10-CM

## 2016-06-13 DIAGNOSIS — O24419 Gestational diabetes mellitus in pregnancy, unspecified control: Secondary | ICD-10-CM

## 2016-06-13 DIAGNOSIS — O10919 Unspecified pre-existing hypertension complicating pregnancy, unspecified trimester: Secondary | ICD-10-CM

## 2016-06-13 DIAGNOSIS — Z331 Pregnant state, incidental: Secondary | ICD-10-CM

## 2016-06-13 DIAGNOSIS — Z1389 Encounter for screening for other disorder: Secondary | ICD-10-CM

## 2016-06-13 DIAGNOSIS — O10913 Unspecified pre-existing hypertension complicating pregnancy, third trimester: Secondary | ICD-10-CM

## 2016-06-13 DIAGNOSIS — Z3493 Encounter for supervision of normal pregnancy, unspecified, third trimester: Secondary | ICD-10-CM

## 2016-06-13 DIAGNOSIS — O09893 Supervision of other high risk pregnancies, third trimester: Secondary | ICD-10-CM

## 2016-06-13 LAB — POCT URINALYSIS DIPSTICK
Blood, UA: NEGATIVE
Glucose, UA: NEGATIVE
KETONES UA: NEGATIVE
LEUKOCYTES UA: NEGATIVE
Nitrite, UA: NEGATIVE
PROTEIN UA: NEGATIVE

## 2016-06-13 NOTE — Progress Notes (Signed)
US 32 wks,cephalic,BPP 8/8 ,fhr 146 bpm,normal ov's bilat,post pl gr 3,RI .56,.59,FHR 12.7cm,EFW 1937 g,51%

## 2016-06-13 NOTE — Progress Notes (Signed)
Patient ID: Susan Reeves, female   DOB: 05/29/1976, 40 y.o.   MRN: [id number]  High Risk Pregnancy Diagnosis(es):   CHTN-no meds, AMA, A1DM  G8P0070 7831w0d Estimated Date of Delivery: 08/08/16  Blood pressure 150/80, pulse 94, weight 237 lb (107.502 kg), last menstrual period 10/22/2015.  Urinalysis: Negative  HPI: Pt states that she has been taking medications for her HTN and DM. Pt reports that the labetalol caused severe nipple pain with three different episodes of medications. Pt notes that she has always had borderline HTN. Pt has been on medications for her HTN 20 years ago. Pt has had three doses of her diabetic medications. Has not tried any medications for the relief of her symptoms. Pt denies any issues at this time.    BP weight and urine results all reviewed and noted. Patient reports good fetal movement, denies any bleeding and no rupture of membranes symptoms or regular contractions.  Fetal Heart rate:  150   Patient is without complaints. All questions were answered.  Assessment:  4931w0d,   CHTN-no meds, AMA, A1DM Medication(s) Plans:  Began 5 mg Norvasc Rx  Follow up in 5 days for OB appt  By signing my name below, I, Soijett Blue, attest that this documentation has been prepared under the direction and in the presence of Tilda BurrowJohn V Keniyah Gelinas, MD. Electronically Signed: Soijett Blue, ED Scribe. 06/13/2016. 4:41 PM.  I personally performed the services described in this documentation, which was SCRIBED in my presence. The recorded information has been reviewed and considered accurate. It has been edited as necessary during review. Tilda BurrowFERGUSON,Jemmie Ledgerwood V, MD

## 2016-06-17 ENCOUNTER — Ambulatory Visit (INDEPENDENT_AMBULATORY_CARE_PROVIDER_SITE_OTHER): Payer: BLUE CROSS/BLUE SHIELD | Admitting: Obstetrics and Gynecology

## 2016-06-17 ENCOUNTER — Encounter: Payer: Self-pay | Admitting: Obstetrics and Gynecology

## 2016-06-17 VITALS — BP 138/82 | HR 92 | Wt 235.0 lb

## 2016-06-17 DIAGNOSIS — O09893 Supervision of other high risk pregnancies, third trimester: Secondary | ICD-10-CM

## 2016-06-17 DIAGNOSIS — O24419 Gestational diabetes mellitus in pregnancy, unspecified control: Secondary | ICD-10-CM | POA: Diagnosis not present

## 2016-06-17 DIAGNOSIS — O09523 Supervision of elderly multigravida, third trimester: Secondary | ICD-10-CM

## 2016-06-17 DIAGNOSIS — O10912 Unspecified pre-existing hypertension complicating pregnancy, second trimester: Secondary | ICD-10-CM

## 2016-06-17 DIAGNOSIS — O10919 Unspecified pre-existing hypertension complicating pregnancy, unspecified trimester: Secondary | ICD-10-CM

## 2016-06-17 DIAGNOSIS — Z1389 Encounter for screening for other disorder: Secondary | ICD-10-CM

## 2016-06-17 DIAGNOSIS — Z331 Pregnant state, incidental: Secondary | ICD-10-CM

## 2016-06-17 DIAGNOSIS — N96 Recurrent pregnancy loss: Secondary | ICD-10-CM

## 2016-06-17 LAB — POCT URINALYSIS DIPSTICK
Blood, UA: NEGATIVE
GLUCOSE UA: NEGATIVE
NITRITE UA: NEGATIVE

## 2016-06-17 MED ORDER — AMLODIPINE BESYLATE 5 MG PO TABS: 5.0000 mg | ORAL_TABLET | Freq: Every day | ORAL | Status: DC

## 2016-06-17 NOTE — Progress Notes (Signed)
Pt states that she has been having headaches. Pt states that she was supposed to start a new BP med but med was not sent to her pharmacy.

## 2016-06-17 NOTE — Progress Notes (Signed)
Patient ID: Susan Reeves, female   DOB: 1976/03/10, 40 y.o.   MRN: [id number]   High Risk Pregnancy HROB Diagnosis(es):  CHTN-no meds, AMA, A1DM  G8P0070 5737w4d Estimated Date of Delivery: 08/08/16    HPI: The patient is being seen today for ongoing management of CHTN-no meds, AMA, A1DM. Today she reports no complaints. Pt states her Norvasc prescription was never called into her pharmacy.  Patient reports good fetal movement, denies any bleeding and no rupture of membranes symptoms or regular contractions.   BP weight and urine results reviewed and noted. Blood pressure 138/82, pulse 92, weight 235 lb (106.595 kg), last menstrual period 10/22/2015.  Fundal Height:  35 cm  Fetal Heart rate:  135 bpm  Physical Examination: Abdomen - soft, nontender, nondistended, no masses or organomegaly                                    Edema:  none  Urinalysis:  POSITIVE for trace leukocytes, trace protein, small ketones   Fetal Surveillance Testing today:  NST, reactive   Lab and sonogram results have been reviewed. Comments: normal NST; UA positive for trace leukocytes, trace protein and small ketones   Assessment:  1.  Pregnancy at 437w4d,  G8P0070   :                         2.  CHTN-now BEGINNING MEDS, WILL START nORVASC 5 QDAY.                            , AMA, A1DM                        3.   Medication(s) Plans:  5 mg Norvasc  Treatment Plan:  Biweekly testing; NST on Mondays and BPP on 3-4 DAYS  Follow up in 4 days for BPP and appointment for high risk OB care,     By signing my name below, I, Doreatha MartinEva Mathews, attest that this documentation has been prepared under the direction and in the presence of Tilda BurrowJohn V Bert Givans, MD. Electronically Signed: Doreatha MartinEva Mathews, ED Scribe. 06/17/2016. 3:45 PM.  I personally performed the services described in this documentation, which was SCRIBED in my presence. The recorded information has been reviewed and considered accurate. It has been edited as necessary  during review. Tilda BurrowFERGUSON,Lameshia Hypolite V, MD

## 2016-06-20 ENCOUNTER — Other Ambulatory Visit: Payer: BLUE CROSS/BLUE SHIELD

## 2016-06-20 ENCOUNTER — Other Ambulatory Visit: Payer: Self-pay | Admitting: Obstetrics and Gynecology

## 2016-06-20 DIAGNOSIS — O163 Unspecified maternal hypertension, third trimester: Secondary | ICD-10-CM

## 2016-06-20 DIAGNOSIS — O09523 Supervision of elderly multigravida, third trimester: Secondary | ICD-10-CM

## 2016-06-20 DIAGNOSIS — O24419 Gestational diabetes mellitus in pregnancy, unspecified control: Secondary | ICD-10-CM

## 2016-06-21 ENCOUNTER — Ambulatory Visit (INDEPENDENT_AMBULATORY_CARE_PROVIDER_SITE_OTHER): Payer: BLUE CROSS/BLUE SHIELD

## 2016-06-21 ENCOUNTER — Ambulatory Visit (INDEPENDENT_AMBULATORY_CARE_PROVIDER_SITE_OTHER): Payer: BLUE CROSS/BLUE SHIELD | Admitting: Obstetrics & Gynecology

## 2016-06-21 VITALS — BP 134/82 | HR 100 | Wt 234.0 lb

## 2016-06-21 DIAGNOSIS — O09893 Supervision of other high risk pregnancies, third trimester: Secondary | ICD-10-CM

## 2016-06-21 DIAGNOSIS — Z1389 Encounter for screening for other disorder: Secondary | ICD-10-CM

## 2016-06-21 DIAGNOSIS — O09523 Supervision of elderly multigravida, third trimester: Secondary | ICD-10-CM | POA: Diagnosis not present

## 2016-06-21 DIAGNOSIS — O24419 Gestational diabetes mellitus in pregnancy, unspecified control: Secondary | ICD-10-CM

## 2016-06-21 DIAGNOSIS — Z3A33 33 weeks gestation of pregnancy: Secondary | ICD-10-CM

## 2016-06-21 DIAGNOSIS — O10919 Unspecified pre-existing hypertension complicating pregnancy, unspecified trimester: Secondary | ICD-10-CM

## 2016-06-21 DIAGNOSIS — Z331 Pregnant state, incidental: Secondary | ICD-10-CM

## 2016-06-21 DIAGNOSIS — O10013 Pre-existing essential hypertension complicating pregnancy, third trimester: Secondary | ICD-10-CM | POA: Diagnosis not present

## 2016-06-21 DIAGNOSIS — O10912 Unspecified pre-existing hypertension complicating pregnancy, second trimester: Secondary | ICD-10-CM

## 2016-06-21 DIAGNOSIS — O163 Unspecified maternal hypertension, third trimester: Secondary | ICD-10-CM

## 2016-06-21 LAB — POCT URINALYSIS DIPSTICK
Blood, UA: NEGATIVE
Glucose, UA: NEGATIVE
LEUKOCYTES UA: NEGATIVE
Nitrite, UA: NEGATIVE

## 2016-06-21 NOTE — Progress Notes (Signed)
US 33+1 wks,cephalic,BPP 8/8,normal ov's bilat,post pl gr 3 RI .54,.55,AFI 13cm

## 2016-06-21 NOTE — Progress Notes (Signed)
Fetal Surveillance Testing today:  BPP 8/8, Dopplers are excellent   High Risk Pregnancy Diagnosis(es):   A1 DM, Chronic Hypertension  G8P0070 4822w1d Estimated Date of Delivery: 08/08/16  Blood pressure 134/82, pulse 100, weight 234 lb (106.142 kg), last menstrual period 10/22/2015.  Urinalysis: Negative   HPI: The patient is being seen today for ongoing management of as above. Today she reports CBG are good   BP weight and urine results all reviewed and noted. Patient reports good fetal movement, denies any bleeding and no rupture of membranes symptoms or regular contractions.  Fundal Height:  34  Fetal Heart rate:  145 Edema:  1+  Patient is without complaints other than noted in her HPI. All questions were answered.  All lab and sonogram results have been reviewed. Comments: abnormal:    Assessment:  1.  Pregnancy at 1422w1d,  Estimated Date of Delivery: 08/08/16 :                          2.  Class A1 DM                        3.  Chronic hypertension  Medication(s) Plans:  norvasc 5 mg daily  Treatment Plan:  Twice weekly surveillance, sonogram alternating with NST, induction at 39 weeks or as clinically indicated   No Follow-up on file. for appointment for high risk OB care  No orders of the defined types were placed in this encounter.   Orders Placed This Encounter  Procedures  . POCT urinalysis dipstick

## 2016-06-24 ENCOUNTER — Ambulatory Visit (INDEPENDENT_AMBULATORY_CARE_PROVIDER_SITE_OTHER): Payer: BLUE CROSS/BLUE SHIELD | Admitting: Obstetrics & Gynecology

## 2016-06-24 VITALS — BP 144/74 | HR 94 | Wt 235.0 lb

## 2016-06-24 DIAGNOSIS — Z1389 Encounter for screening for other disorder: Secondary | ICD-10-CM

## 2016-06-24 DIAGNOSIS — O24419 Gestational diabetes mellitus in pregnancy, unspecified control: Secondary | ICD-10-CM | POA: Diagnosis not present

## 2016-06-24 DIAGNOSIS — Z3A35 35 weeks gestation of pregnancy: Secondary | ICD-10-CM

## 2016-06-24 DIAGNOSIS — O10919 Unspecified pre-existing hypertension complicating pregnancy, unspecified trimester: Secondary | ICD-10-CM

## 2016-06-24 DIAGNOSIS — Z3A34 34 weeks gestation of pregnancy: Secondary | ICD-10-CM | POA: Diagnosis not present

## 2016-06-24 DIAGNOSIS — O10912 Unspecified pre-existing hypertension complicating pregnancy, second trimester: Secondary | ICD-10-CM

## 2016-06-24 DIAGNOSIS — Z331 Pregnant state, incidental: Secondary | ICD-10-CM

## 2016-06-24 DIAGNOSIS — O09893 Supervision of other high risk pregnancies, third trimester: Secondary | ICD-10-CM

## 2016-06-24 DIAGNOSIS — O10013 Pre-existing essential hypertension complicating pregnancy, third trimester: Secondary | ICD-10-CM | POA: Diagnosis not present

## 2016-06-24 LAB — POCT URINALYSIS DIPSTICK
GLUCOSE UA: NEGATIVE
Ketones, UA: NEGATIVE
LEUKOCYTES UA: NEGATIVE
NITRITE UA: NEGATIVE
RBC UA: NEGATIVE

## 2016-06-24 NOTE — Progress Notes (Signed)
Fetal Surveillance Testing today:  Reactive NST   High Risk Pregnancy Diagnosis(es):   Class A2 DM, Chronic Hypertension  G8P0070 [redacted]w[redacted]d Estimated Date of Delivery: 08/08/16  Blood pressure (!) 144/74, pulse 94, weight 235 lb (106.6 kg), last menstrual period 10/22/2015.  Urinalysis: Negative   HPI: The patient is being seen today for ongoing management of Class A2 DM, Chronic Hypertension. Today she reports CBG overall pretty good, an occasional above 120, highest 120   BP weight and urine results all reviewed and noted. Patient reports good fetal movement, denies any bleeding and no rupture of membranes symptoms or regular contractions.  Fundal Height:  35 Fetal Heart rate:  135 Edema:  none  Patient is without complaints other than noted in her HPI. All questions were answered.  All lab and sonogram results have been reviewed. Comments: abnormal:    Assessment:  1.  Pregnancy at [redacted]w[redacted]d,  Estimated Date of Delivery: 08/08/16 :                          2.  Class A2 DM                        3.  Chronic Hypertension  Medication(s) Plans:  Continue glyburide 2.5 mg BID and norvasc 5 mg daily  Treatment Plan:  Twice weekly surveillance, sonogram alternating with NST, induction at 39 weeks or as clinically indicated   Return in about 3 days (around 06/27/2016) for NST, HROB. for appointment for high risk OB care  No orders of the defined types were placed in this encounter.  Orders Placed This Encounter  Procedures  . POCT urinalysis dipstick

## 2016-06-24 NOTE — Progress Notes (Signed)
   06/24/16 1004  Fetal Testing Method  Fetal testing type NST  Fetal Heart Rate A  Baseline Rate (A) 135 bpm  Variability 6-25 BPM  Accelerations 15 x 15  Multiple birth? N  Interpretation (Fetal Testing)  Nonstress Test Interpretation Reactive

## 2016-06-26 ENCOUNTER — Telehealth: Payer: Self-pay | Admitting: *Deleted

## 2016-06-26 NOTE — Telephone Encounter (Signed)
Pt states she is 34w and has been keeping a log of BP readings, her bp just now was 145/93, a couple hours ago it was 149/93, this morning it was 176/109, last night it was 160/101 and yesterday evening it was 151/87.  Pt states she is taking her medication as RX'd and has not been having any blurry vision, she states she did have a headache last night.  Pt states she has a NST and OV scheduled here tomorrow morning.  Pt advised to push H2O, continue to monitor BP and if develops any symptoms such as seeing spots, blurry vision or bad headache call us back or go to [facility].  Pt verbalized understanding.

## 2016-06-27 ENCOUNTER — Ambulatory Visit (INDEPENDENT_AMBULATORY_CARE_PROVIDER_SITE_OTHER): Payer: BLUE CROSS/BLUE SHIELD | Admitting: Obstetrics & Gynecology

## 2016-06-27 VITALS — BP 140/78 | HR 94 | Wt 236.0 lb

## 2016-06-27 DIAGNOSIS — Z331 Pregnant state, incidental: Secondary | ICD-10-CM

## 2016-06-27 DIAGNOSIS — O24419 Gestational diabetes mellitus in pregnancy, unspecified control: Secondary | ICD-10-CM | POA: Diagnosis not present

## 2016-06-27 DIAGNOSIS — Z3A34 34 weeks gestation of pregnancy: Secondary | ICD-10-CM | POA: Diagnosis not present

## 2016-06-27 DIAGNOSIS — Z1389 Encounter for screening for other disorder: Secondary | ICD-10-CM

## 2016-06-27 DIAGNOSIS — O10913 Unspecified pre-existing hypertension complicating pregnancy, third trimester: Secondary | ICD-10-CM

## 2016-06-27 DIAGNOSIS — O09893 Supervision of other high risk pregnancies, third trimester: Secondary | ICD-10-CM

## 2016-06-27 LAB — POCT URINALYSIS DIPSTICK
GLUCOSE UA: NEGATIVE
LEUKOCYTES UA: NEGATIVE
NITRITE UA: NEGATIVE

## 2016-06-27 MED ORDER — AMLODIPINE BESYLATE 10 MG PO TABS: 10.0000 mg | ORAL_TABLET | Freq: Every day | ORAL | 11 refills | Status: DC

## 2016-06-27 NOTE — Telephone Encounter (Signed)
I bumper her BP meds this am during her visit, so taken care of

## 2016-06-27 NOTE — Progress Notes (Signed)
Fetal Surveillance Testing today:  Reactive NST   High Risk Pregnancy Diagnosis(es):   Class AII diabetes and chronic hypertension requiring medications  G8P0070 64w0dEstimated Date of Delivery: 08/08/16  Blood pressure 140/78, pulse 94, weight 236 lb (107 kg), last menstrual period 10/22/2015.  Urinalysis: Negative   HPI: The patient is being seen today for ongoing management of as above class a2 diabetes and chronic hypertension now requiring medications. Today she reports I am having patient do blood pressures at home as well and her blood pressures creeping up more in the 140-150/95-100 range maintained average some can increase her Norvasc to 10 mg.  Interestingly labetalol calls still significant nipple pain/burning which the patient could not tolerate and so we started Norvasc.  Ald[place] met was not attempted it appears. CBGs are also a little suboptimal) 2-1/2 mg twice a day for now Patient smoke about 10 cigarettes a day and she once again is admonished sternly to stop smoking   BP weight and urine results all reviewed and noted. Patient reports good fetal movement, denies any bleeding and no rupture of membranes symptoms or regular contractions.  Fundal Height:  36 Fetal Heart rate:  140 Edema:  none  Patient is without complaints other than noted in her HPI. All questions were answered.  All lab and sonogram results have been reviewed. Comments: abnormal:    Assessment:  1.  Pregnancy at 316w0d Estimated Date of Delivery: 08/08/16 :                          2.  Class A2 DM                        3.  Chronic hypertension suboptimal control  Medication(s) Plans:  For now continue glyburide 2-1/2 mg twice a day we'll increase Norvasc to 10 mg daily  Treatment Plan:  Twice weekly surveillance, sonogram alternating with NST, induction at 39 weeks or as clinically indicated Also we'll be checking a CBC and a CMP today at least give [place] baseline not concerned about them being  abnormal today but at least want to know where they are given the fact that her blood pressure status is worsening  No Follow-up on file. for appointment for high risk OB care  No orders of the defined types were placed in this encounter.  Orders Placed This Encounter  Procedures  . POCT urinalysis dipstick

## 2016-06-28 ENCOUNTER — Other Ambulatory Visit: Payer: Self-pay | Admitting: Obstetrics & Gynecology

## 2016-06-28 DIAGNOSIS — O10913 Unspecified pre-existing hypertension complicating pregnancy, third trimester: Secondary | ICD-10-CM

## 2016-06-28 LAB — COMPREHENSIVE METABOLIC PANEL
ALBUMIN: 3.5 g/dL (ref 3.5–5.5)
ALK PHOS: 144 IU/L — AB (ref 39–117)
ALT: 10 IU/L (ref 0–32)
AST: 16 IU/L (ref 0–40)
Albumin/Globulin Ratio: 1.2 (ref 1.2–2.2)
BUN / CREAT RATIO: 20 (ref 9–23)
BUN: 9 mg/dL (ref 6–24)
Bilirubin Total: 0.3 mg/dL (ref 0.0–1.2)
CO2: 21 mmol/L (ref 18–29)
CREATININE: 0.46 mg/dL — AB (ref 0.57–1.00)
Calcium: 9.2 mg/dL (ref 8.7–10.2)
Chloride: 98 mmol/L (ref 96–106)
GFR, EST AFRICAN AMERICAN: 143 mL/min/{1.73_m2} (ref >59)
GFR, EST NON AFRICAN AMERICAN: 124 mL/min/{1.73_m2} (ref >59)
GLOBULIN, TOTAL: 2.9 g/dL (ref 1.5–4.5)
GLUCOSE: 79 mg/dL (ref 65–99)
Potassium: 3.8 mmol/L (ref 3.5–5.2)
SODIUM: 138 mmol/L (ref 134–144)
TOTAL PROTEIN: 6.4 g/dL (ref 6.0–8.5)

## 2016-06-28 LAB — CBC
HEMATOCRIT: 40.8 % (ref 34.0–46.6)
HEMOGLOBIN: 13.7 g/dL (ref 11.1–15.9)
MCH: 31.6 pg (ref 26.6–33.0)
MCHC: 33.6 g/dL (ref 31.5–35.7)
MCV: 94 fL (ref 79–97)
Platelets: 275 10*3/uL (ref 150–379)
RBC: 4.34 x10E6/uL (ref 3.77–5.28)
RDW: 13.5 % (ref 12.3–15.4)
WBC: 11.6 10*3/uL — ABNORMAL HIGH (ref 3.4–10.8)

## 2016-07-01 ENCOUNTER — Ambulatory Visit (INDEPENDENT_AMBULATORY_CARE_PROVIDER_SITE_OTHER): Payer: BLUE CROSS/BLUE SHIELD

## 2016-07-01 ENCOUNTER — Ambulatory Visit (INDEPENDENT_AMBULATORY_CARE_PROVIDER_SITE_OTHER): Payer: BLUE CROSS/BLUE SHIELD | Admitting: Obstetrics & Gynecology

## 2016-07-01 VITALS — BP 148/80 | HR 84 | Wt 237.0 lb

## 2016-07-01 DIAGNOSIS — O24419 Gestational diabetes mellitus in pregnancy, unspecified control: Secondary | ICD-10-CM | POA: Diagnosis not present

## 2016-07-01 DIAGNOSIS — O09523 Supervision of elderly multigravida, third trimester: Secondary | ICD-10-CM

## 2016-07-01 DIAGNOSIS — O09893 Supervision of other high risk pregnancies, third trimester: Secondary | ICD-10-CM

## 2016-07-01 DIAGNOSIS — O10919 Unspecified pre-existing hypertension complicating pregnancy, unspecified trimester: Secondary | ICD-10-CM

## 2016-07-01 DIAGNOSIS — O10913 Unspecified pre-existing hypertension complicating pregnancy, third trimester: Secondary | ICD-10-CM | POA: Diagnosis not present

## 2016-07-01 DIAGNOSIS — O10912 Unspecified pre-existing hypertension complicating pregnancy, second trimester: Secondary | ICD-10-CM

## 2016-07-01 DIAGNOSIS — Z3A35 35 weeks gestation of pregnancy: Secondary | ICD-10-CM

## 2016-07-01 DIAGNOSIS — Z331 Pregnant state, incidental: Secondary | ICD-10-CM

## 2016-07-01 DIAGNOSIS — Z1389 Encounter for screening for other disorder: Secondary | ICD-10-CM

## 2016-07-01 LAB — POCT URINALYSIS DIPSTICK
Blood, UA: NEGATIVE
GLUCOSE UA: NEGATIVE
KETONES UA: NEGATIVE
Leukocytes, UA: NEGATIVE
Nitrite, UA: NEGATIVE

## 2016-07-01 NOTE — Progress Notes (Signed)
Fetal Surveillance Testing today:  BPP 8/8 with excellent Doppler flow   High Risk Pregnancy Diagnosis(es):   AMA, CHTN, Class A2 DM, bordrline growth  G8P0070 [redacted]w[redacted]d Estimated Date of Delivery: 08/08/16  Blood pressure (!) 148/80, pulse 84, weight 237 lb (107.5 kg), last menstrual period 10/22/2015.  Urinalysis: Negative   HPI: The patient is being seen today for ongoing management of as above. Today she reports had some fluid.  SSE mucous no fluid   BP weight and urine results all reviewed and noted. Patient reports good fetal movement, denies any bleeding and no rupture of membranes symptoms or regular contractions.  Fundal Height:  36 Fetal Heart rate:  142 Edema:  none  Patient is without complaints other than noted in her HPI. All questions were answered.  All lab and sonogram results have been reviewed. Comments: abnormal:    Assessment:  1.  Pregnancy at [redacted]w[redacted]d,  Estimated Date of Delivery: 08/08/16 :                          2.  Class A2 DM                        3.  CHTN on norvasc 5mg   Medication(s) Plans:  Increase evening glyburide 5 mg, am continue 2.5 mg, continue norvasc 5 mg daily  Treatment Plan:  Twice weekly surveillance, sonogram alternating with NST, induction at 39 weeks or as clinically indicated   No Follow-up on file. for appointment for high risk OB care  No orders of the defined types were placed in this encounter.  Orders Placed This Encounter  Procedures  . POCT urinalysis dipstick

## 2016-07-01 NOTE — Progress Notes (Signed)
OB F/U, BPP, UA dopplers today @ 34+[redacted] weeks GA. Single active fetus with FHR 144. EFW today 2202g which is consistent with dating by early [place]. EDD 08/08/16. Placenta is posterior GR 3. BPP 8/8. Bilateral ovaries nonvisualized. AFI 13.3cm. Fetus is in cephalic position. RI 0.52 and 0.53. S/D ration 2.09 and 2.12.

## 2016-07-04 ENCOUNTER — Encounter: Payer: Self-pay | Admitting: Obstetrics & Gynecology

## 2016-07-04 ENCOUNTER — Ambulatory Visit (INDEPENDENT_AMBULATORY_CARE_PROVIDER_SITE_OTHER): Payer: BLUE CROSS/BLUE SHIELD | Admitting: Obstetrics & Gynecology

## 2016-07-04 VITALS — BP 140/80 | HR 104 | Wt 235.0 lb

## 2016-07-04 DIAGNOSIS — N96 Recurrent pregnancy loss: Secondary | ICD-10-CM

## 2016-07-04 DIAGNOSIS — O09523 Supervision of elderly multigravida, third trimester: Secondary | ICD-10-CM

## 2016-07-04 DIAGNOSIS — O10919 Unspecified pre-existing hypertension complicating pregnancy, unspecified trimester: Secondary | ICD-10-CM

## 2016-07-04 DIAGNOSIS — Z1389 Encounter for screening for other disorder: Secondary | ICD-10-CM

## 2016-07-04 DIAGNOSIS — O10912 Unspecified pre-existing hypertension complicating pregnancy, second trimester: Secondary | ICD-10-CM

## 2016-07-04 DIAGNOSIS — O09893 Supervision of other high risk pregnancies, third trimester: Secondary | ICD-10-CM

## 2016-07-04 DIAGNOSIS — O24419 Gestational diabetes mellitus in pregnancy, unspecified control: Secondary | ICD-10-CM | POA: Diagnosis not present

## 2016-07-04 DIAGNOSIS — Z331 Pregnant state, incidental: Secondary | ICD-10-CM

## 2016-07-04 DIAGNOSIS — Z3A35 35 weeks gestation of pregnancy: Secondary | ICD-10-CM

## 2016-07-04 LAB — POCT URINALYSIS DIPSTICK
Blood, UA: NEGATIVE
Glucose, UA: NEGATIVE
Ketones, UA: NEGATIVE
LEUKOCYTES UA: NEGATIVE
Nitrite, UA: NEGATIVE

## 2016-07-04 MED ORDER — GLYBURIDE 2.5 MG PO TABS: ORAL_TABLET | ORAL | 3 refills | Status: DC

## 2016-07-04 NOTE — Progress Notes (Signed)
Pt states that she had a creamy white discharge yesterday.

## 2016-07-04 NOTE — Progress Notes (Signed)
Fetal Surveillance Testing today:  Reactive NST   High Risk Pregnancy Diagnosis(es):   Class AII diabetes, chronic hypertension maternal age greater than 40,  G8P0070 [redacted]w[redacted]d Estimated Date of Delivery: 08/08/16  Blood pressure 140/80, pulse (!) 104, weight 235 lb (106.6 kg), last menstrual period 10/22/2015.  Urinalysis: Negative   HPI: The patient is being seen today for ongoing management of as above. Today she reports CBGs are overall pretty decent, blood pressures in her usual 135-150/80-95 range  BP weight and urine results all reviewed and noted. Patient reports good fetal movement, denies any bleeding and no rupture of membranes symptoms or regular contractions.  Fundal Height:  38  Fetal Heart rate:  135  Edema:  1+  Patient is without complaints other than noted in her HPI. All questions were answered.  All lab and sonogram results have been reviewed. Comments: abnormal:    Assessment:  1.  Pregnancy at [redacted]w[redacted]d,  Estimated Date of Delivery: 08/08/16 :                          2.  Class a 2 diabetes mellitus, stable                        3.  Chronic hypertension , stable   Medication(s) Plans:  Glyburide 2-1/2 mg in the morning and 5 mg in the evening , amlodipine 5 mg daily baby aspirin 81 mg daily  Treatment Plan:  Twice weekly surveillance, sonogram alternating with NST, induction at 39 weeks or as clinically indicated   Return in about 4 days (around 07/08/2016) for BPP/sono, HROB. for appointment for high risk OB care  No orders of the defined types were placed in this encounter.  Orders Placed This Encounter  Procedures  . [place] UA Cord Doppler  . US Fetal BPP W/O Non Stress  . POCT urinalysis dipstick  . Fetal nonstress test

## 2016-07-08 ENCOUNTER — Ambulatory Visit (INDEPENDENT_AMBULATORY_CARE_PROVIDER_SITE_OTHER): Payer: BLUE CROSS/BLUE SHIELD

## 2016-07-08 ENCOUNTER — Ambulatory Visit (INDEPENDENT_AMBULATORY_CARE_PROVIDER_SITE_OTHER): Payer: BLUE CROSS/BLUE SHIELD | Admitting: Obstetrics and Gynecology

## 2016-07-08 VITALS — BP 144/72 | HR 90 | Wt 238.0 lb

## 2016-07-08 DIAGNOSIS — O09893 Supervision of other high risk pregnancies, third trimester: Secondary | ICD-10-CM

## 2016-07-08 DIAGNOSIS — O24419 Gestational diabetes mellitus in pregnancy, unspecified control: Secondary | ICD-10-CM

## 2016-07-08 DIAGNOSIS — O10919 Unspecified pre-existing hypertension complicating pregnancy, unspecified trimester: Secondary | ICD-10-CM

## 2016-07-08 DIAGNOSIS — Z1389 Encounter for screening for other disorder: Secondary | ICD-10-CM

## 2016-07-08 DIAGNOSIS — Z331 Pregnant state, incidental: Secondary | ICD-10-CM

## 2016-07-08 DIAGNOSIS — O09523 Supervision of elderly multigravida, third trimester: Secondary | ICD-10-CM | POA: Diagnosis not present

## 2016-07-08 DIAGNOSIS — O10912 Unspecified pre-existing hypertension complicating pregnancy, second trimester: Secondary | ICD-10-CM | POA: Diagnosis not present

## 2016-07-08 DIAGNOSIS — Z3A36 36 weeks gestation of pregnancy: Secondary | ICD-10-CM

## 2016-07-08 LAB — POCT URINALYSIS DIPSTICK
Blood, UA: NEGATIVE
GLUCOSE UA: NEGATIVE
LEUKOCYTES UA: NEGATIVE
NITRITE UA: NEGATIVE

## 2016-07-08 NOTE — Progress Notes (Signed)
Patient ID: Susan Reeves, female   DOB: 15-May-1976, 40 y.o.   MRN: [id number]   High Risk Pregnancy HROB Diagnosis(es):   Sharion Dove2DM, CHTN, maternal age 41>40   [id number] 3166w4d Estimated Date of Delivery: 08/08/16    HPI: The patient is being seen today for ongoing management of  A2DM, CHTN, maternal age 82>40  .  Pt presents with her CBG log today.  Patient reports good fetal movement, denies any bleeding and no rupture of membranes symptoms or regular contractions.   BP weight and urine results reviewed and noted. Blood pressure (!) 144/72, pulse 90, weight 238 lb (108 kg), last menstrual period 10/22/2015.  Fundal Height:  35 cm  Fetal Heart rate:  135 bpm  Physical Examination: Abdomen - soft, nontender, nondistended, no masses or organomegaly                                     Edema:  none  Urinalysis:NEGATIVE for glucose                 POSITIVE for large ketones, trace protein  Fetal Surveillance Testing today:  Cord doppler, BPP  Lab and sonogram results have been reviewed. Comments: normal   Assessment:  1.  Pregnancy at 7066w4d,  G8P0070   :  Estimated Date of Delivery: 08/08/16                         2.   A2DM, CHTN, maternal age 87>40                         3. CBG log reviewed in office today; discussed healthy eating habits   Medication(s) Plans:  Glyburide 2-1/2 mg in the morning and 5 mg in the evening , amlodipine 5 mg daily baby aspirin 81 mg daily   Treatment Plan:  Twice weekly surveillance, sonogram alternating with NST  Follow up in 3 days for appointment for high risk OB care, NST   By signing my name below, I, Doreatha MartinEva Mathews, attest that this documentation has been prepared under the direction and in the presence of Tilda BurrowJohn V Keyarra Rendall, MD. Electronically Signed: Doreatha MartinEva Mathews, ED Scribe. 07/08/16. 2:01 PM.  I personally performed the services described in this documentation, which was SCRIBED in my presence. The recorded information has been reviewed and considered accurate. It  has been edited as necessary during review. Tilda BurrowFERGUSON,Correy Weidner V, MD

## 2016-07-08 NOTE — Progress Notes (Signed)
US 35+4 wks,cephalic,post pl gr 3,normal ov's bilat,BPP 8/8,fhr 135 bpm,RI .64,.60,AFI 10.6 cm

## 2016-07-11 ENCOUNTER — Ambulatory Visit (INDEPENDENT_AMBULATORY_CARE_PROVIDER_SITE_OTHER): Payer: BLUE CROSS/BLUE SHIELD | Admitting: Obstetrics & Gynecology

## 2016-07-11 VITALS — BP 130/74 | HR 90 | Wt 239.0 lb

## 2016-07-11 DIAGNOSIS — Z369 Encounter for antenatal screening, unspecified: Secondary | ICD-10-CM

## 2016-07-11 DIAGNOSIS — Z331 Pregnant state, incidental: Secondary | ICD-10-CM

## 2016-07-11 DIAGNOSIS — O09893 Supervision of other high risk pregnancies, third trimester: Secondary | ICD-10-CM

## 2016-07-11 DIAGNOSIS — O09523 Supervision of elderly multigravida, third trimester: Secondary | ICD-10-CM

## 2016-07-11 DIAGNOSIS — O24419 Gestational diabetes mellitus in pregnancy, unspecified control: Secondary | ICD-10-CM | POA: Diagnosis not present

## 2016-07-11 DIAGNOSIS — O10919 Unspecified pre-existing hypertension complicating pregnancy, unspecified trimester: Secondary | ICD-10-CM

## 2016-07-11 DIAGNOSIS — Z3A36 36 weeks gestation of pregnancy: Secondary | ICD-10-CM

## 2016-07-11 DIAGNOSIS — O10912 Unspecified pre-existing hypertension complicating pregnancy, second trimester: Secondary | ICD-10-CM | POA: Diagnosis not present

## 2016-07-11 DIAGNOSIS — Z1389 Encounter for screening for other disorder: Secondary | ICD-10-CM

## 2016-07-11 DIAGNOSIS — Z3685 Encounter for antenatal screening for Streptococcus B: Secondary | ICD-10-CM

## 2016-07-11 LAB — POCT URINALYSIS DIPSTICK
Blood, UA: NEGATIVE
Glucose, UA: NEGATIVE
Ketones, UA: NEGATIVE
Leukocytes, UA: NEGATIVE
Nitrite, UA: NEGATIVE

## 2016-07-11 LAB — OB RESULTS CONSOLE GBS: STREP GROUP B AG: POSITIVE

## 2016-07-11 MED ORDER — GLYBURIDE 2.5 MG PO TABS: ORAL_TABLET | ORAL | 3 refills | Status: DC

## 2016-07-11 NOTE — Addendum Note (Signed)
Addended by: Noreene LarssonSTALLINGS, Florabel Faulks M on: 07/11/2016 03:37 PM   Modules accepted: Orders

## 2016-07-11 NOTE — Progress Notes (Signed)
Fetal Surveillance Testing today:  Reactive NST   High Risk Pregnancy Diagnosis(es):    Class A2 DM and chronic hypertension, AMA>40  G8P0070 3345w0d Estimated Date of Delivery: 08/08/16  Blood pressure 130/74, pulse 90, weight 239 lb (108.4 kg), last menstrual period 10/22/2015.  Urinalysis: Negative   HPI: The patient is being seen today for ongoing management of as above. Today she reports CBG are a bit high at night, BP 130/70-150/95 range   BP weight and urine results all reviewed and noted. Patient reports good fetal movement, denies any bleeding and no rupture of membranes symptoms or regular contractions.  Fundal Height:  37 Fetal Heart rate:  135 Edema:  trace  Patient is without complaints other than noted in her HPI. All questions were answered.  All lab and sonogram results have been reviewed. Comments: abnormal:    Assessment:  1.  Pregnancy at 2145w0d,  Estimated Date of Delivery: 08/08/16 :                          2.  Class A2 DM                        3.  Chronic hypertension                       4.  AMA >40  Medication(s) Plans:  Increase pm glyburide to 7.5 mg keep am at 2.5, cont amlodipine 5 mg daily  Treatment Plan:  Twice weekly surveillance, sonogram alternating with NST, induction at 39 weeks or as clinically indicated   Return in about 4 days (around 07/15/2016) for NST, HROB. for appointment for high risk OB care  No orders of the defined types were placed in this encounter.  Orders Placed This Encounter  Procedures  . US UA Cord Doppler  . US Fetal BPP W/O Non Stress  . POCT urinalysis dipstick

## 2016-07-12 ENCOUNTER — Telehealth: Payer: Self-pay | Admitting: *Deleted

## 2016-07-12 LAB — GC/CHLAMYDIA PROBE AMP
CHLAMYDIA, DNA PROBE: NEGATIVE
Neisseria gonorrhoeae by PCR: NEGATIVE

## 2016-07-12 NOTE — Telephone Encounter (Signed)
Pt informed per Dr. Despina HiddenEure he did want her to have a nst for Monday 07/15/2016 and the ultrasound to be a the next visit. Pt verbalized understanding.

## 2016-07-13 LAB — STREP GP B NAA: STREP GROUP B AG: POSITIVE — AB

## 2016-07-15 ENCOUNTER — Encounter: Payer: Self-pay | Admitting: Obstetrics & Gynecology

## 2016-07-15 ENCOUNTER — Ambulatory Visit (INDEPENDENT_AMBULATORY_CARE_PROVIDER_SITE_OTHER): Payer: BLUE CROSS/BLUE SHIELD | Admitting: Obstetrics & Gynecology

## 2016-07-15 VITALS — BP 130/80 | HR 90 | Wt 236.0 lb

## 2016-07-15 DIAGNOSIS — Z331 Pregnant state, incidental: Secondary | ICD-10-CM

## 2016-07-15 DIAGNOSIS — O24419 Gestational diabetes mellitus in pregnancy, unspecified control: Secondary | ICD-10-CM | POA: Diagnosis not present

## 2016-07-15 DIAGNOSIS — O10912 Unspecified pre-existing hypertension complicating pregnancy, second trimester: Secondary | ICD-10-CM

## 2016-07-15 DIAGNOSIS — Z1389 Encounter for screening for other disorder: Secondary | ICD-10-CM

## 2016-07-15 DIAGNOSIS — Z3A37 37 weeks gestation of pregnancy: Secondary | ICD-10-CM

## 2016-07-15 DIAGNOSIS — O09523 Supervision of elderly multigravida, third trimester: Secondary | ICD-10-CM | POA: Diagnosis not present

## 2016-07-15 DIAGNOSIS — O09893 Supervision of other high risk pregnancies, third trimester: Secondary | ICD-10-CM

## 2016-07-15 DIAGNOSIS — O10919 Unspecified pre-existing hypertension complicating pregnancy, unspecified trimester: Secondary | ICD-10-CM

## 2016-07-15 LAB — POCT URINALYSIS DIPSTICK
GLUCOSE UA: NEGATIVE
KETONES UA: NEGATIVE
Leukocytes, UA: NEGATIVE
Nitrite, UA: NEGATIVE
Protein, UA: NEGATIVE
RBC UA: NEGATIVE

## 2016-07-15 NOTE — Progress Notes (Signed)
Fetal Surveillance Testing today:  Reactive NST   High Risk Pregnancy Diagnosis(es):   Class A2 DM, CHTN, AMA  G8P0070 4058w4d Estimated Date of Delivery: 08/08/16  Blood pressure 130/80, pulse 90, weight 236 lb (107 kg), last menstrual period 10/22/2015.  Urinalysis: Negative   HPI: The patient is being seen today for ongoing management of as above. Today she reports CBG are godd and BP stable   BP weight and urine results all reviewed and noted. Patient reports good fetal movement, denies any bleeding and no rupture of membranes symptoms or regular contractions.  Fundal Height:  37 Fetal Heart rate:  135 Edema:  1+  Patient is without complaints other than noted in her HPI. All questions were answered.  All lab and sonogram results have been reviewed. Comments: abnormal:    Assessment:  1.  Pregnancy at 5758w4d,  Estimated Date of Delivery: 08/08/16 :                          2.  Class A2 DM                        3.  CHTN                       4.   AMA  Medication(s) Plans:  Glyburide 7.5 pm, 2.5 in am, norvasc 10 mg  Treatment Plan:  Twice weekly surveillance, sonogram alternating with NST, induction at 39 weeks or as clinically indicated   Return in about 3 days (around 07/18/2016) for BPP/sono, HROB. for appointment for high risk OB care  No orders of the defined types were placed in this encounter.  Orders Placed This Encounter  Procedures  . POCT urinalysis dipstick

## 2016-07-18 ENCOUNTER — Ambulatory Visit (INDEPENDENT_AMBULATORY_CARE_PROVIDER_SITE_OTHER): Payer: BLUE CROSS/BLUE SHIELD

## 2016-07-18 ENCOUNTER — Other Ambulatory Visit: Payer: Self-pay | Admitting: Obstetrics & Gynecology

## 2016-07-18 ENCOUNTER — Ambulatory Visit (INDEPENDENT_AMBULATORY_CARE_PROVIDER_SITE_OTHER): Payer: BLUE CROSS/BLUE SHIELD | Admitting: Obstetrics & Gynecology

## 2016-07-18 VITALS — BP 118/70 | HR 88 | Wt 238.0 lb

## 2016-07-18 DIAGNOSIS — Z3A37 37 weeks gestation of pregnancy: Secondary | ICD-10-CM

## 2016-07-18 DIAGNOSIS — O10913 Unspecified pre-existing hypertension complicating pregnancy, third trimester: Secondary | ICD-10-CM

## 2016-07-18 DIAGNOSIS — O24419 Gestational diabetes mellitus in pregnancy, unspecified control: Secondary | ICD-10-CM | POA: Diagnosis not present

## 2016-07-18 DIAGNOSIS — O10919 Unspecified pre-existing hypertension complicating pregnancy, unspecified trimester: Secondary | ICD-10-CM

## 2016-07-18 DIAGNOSIS — O09523 Supervision of elderly multigravida, third trimester: Secondary | ICD-10-CM

## 2016-07-18 DIAGNOSIS — O09893 Supervision of other high risk pregnancies, third trimester: Secondary | ICD-10-CM

## 2016-07-18 DIAGNOSIS — Z1389 Encounter for screening for other disorder: Secondary | ICD-10-CM

## 2016-07-18 DIAGNOSIS — O10912 Unspecified pre-existing hypertension complicating pregnancy, second trimester: Secondary | ICD-10-CM

## 2016-07-18 DIAGNOSIS — Z331 Pregnant state, incidental: Secondary | ICD-10-CM

## 2016-07-18 LAB — POCT URINALYSIS DIPSTICK
Glucose, UA: NEGATIVE
Leukocytes, UA: NEGATIVE
Nitrite, UA: NEGATIVE
PROTEIN UA: NEGATIVE
RBC UA: NEGATIVE

## 2016-07-18 NOTE — Progress Notes (Signed)
Fetal Surveillance Testing today:  sono BPP 8/8   High Risk Pregnancy Diagnosis(es):   CHTN, Class A2 DM  G8P0070 7931w0d Estimated Date of Delivery: 08/08/16  Blood pressure 118/70, pulse 88, weight 238 lb (108 kg), last menstrual period 10/22/2015.  Urinalysis: Negative   HPI: The patient is being seen today for ongoing management of as above. Today she reports CBG are excellent, BP are good as well   BP weight and urine results all reviewed and noted. Patient reports good fetal movement, denies any bleeding and no rupture of membranes symptoms or regular contractions.  Fundal Height:  38 Fetal Heart rate:  158 Edema:  trace  Patient is without complaints other than noted in her HPI. All questions were answered.  All lab and sonogram results have been reviewed. Comments: abnormal:    Assessment:  1.  Pregnancy at 4231w0d,  Estimated Date of Delivery: 08/08/16 :                          2.  Class A2 DM                        3.  CHTN  Medication(s) Plans:  Continue glyburide 2.5 am, 7.5 pm, amlodipine 10  Treatment Plan:  Twice weekly surveillance, sonogram alternating with NST, induction at 39 weeks or as clinically indicated   No Follow-up on file. for appointment for high risk OB care  No orders of the defined types were placed in this encounter.  Orders Placed This Encounter  Procedures  . POCT urinalysis dipstick

## 2016-07-18 NOTE — Progress Notes (Signed)
US 37 wks,cephalic,BPP 8/8,post pl gr 3,normal ov's bilat,afi 10.6 cm,RI .56,.58,FHR 158 bpm,EFW 3017 g 47%

## 2016-07-22 ENCOUNTER — Ambulatory Visit (INDEPENDENT_AMBULATORY_CARE_PROVIDER_SITE_OTHER): Payer: BLUE CROSS/BLUE SHIELD | Admitting: Obstetrics & Gynecology

## 2016-07-22 ENCOUNTER — Encounter: Payer: Self-pay | Admitting: Obstetrics & Gynecology

## 2016-07-22 VITALS — BP 110/70 | HR 86 | Wt 235.0 lb

## 2016-07-22 DIAGNOSIS — Z3A38 38 weeks gestation of pregnancy: Secondary | ICD-10-CM

## 2016-07-22 DIAGNOSIS — O24419 Gestational diabetes mellitus in pregnancy, unspecified control: Secondary | ICD-10-CM

## 2016-07-22 DIAGNOSIS — O09893 Supervision of other high risk pregnancies, third trimester: Secondary | ICD-10-CM

## 2016-07-22 DIAGNOSIS — O09523 Supervision of elderly multigravida, third trimester: Secondary | ICD-10-CM | POA: Diagnosis not present

## 2016-07-22 DIAGNOSIS — Z1389 Encounter for screening for other disorder: Secondary | ICD-10-CM

## 2016-07-22 DIAGNOSIS — O10912 Unspecified pre-existing hypertension complicating pregnancy, second trimester: Secondary | ICD-10-CM | POA: Diagnosis not present

## 2016-07-22 DIAGNOSIS — O10919 Unspecified pre-existing hypertension complicating pregnancy, unspecified trimester: Secondary | ICD-10-CM

## 2016-07-22 DIAGNOSIS — Z331 Pregnant state, incidental: Secondary | ICD-10-CM

## 2016-07-22 LAB — POCT URINALYSIS DIPSTICK
Glucose, UA: NEGATIVE
LEUKOCYTES UA: NEGATIVE
NITRITE UA: NEGATIVE
RBC UA: NEGATIVE

## 2016-07-22 NOTE — Progress Notes (Signed)
Fetal Surveillance Testing today:  Reactive NST   High Risk Pregnancy Diagnosis(es):   Chronic hypertension, class a2 dm, AMA  G8P0070 6377w4d Estimated Date of Delivery: 08/08/16  Blood pressure 110/70, pulse 86, weight 235 lb (106.6 kg), last menstrual period 10/22/2015.  Urinalysis: Negative   HPI: The patient is being seen today for ongoing management of as above. Today she reports CBG and BP at home are good   BP weight and urine results all reviewed and noted. Patient reports good fetal movement, denies any bleeding and no rupture of membranes symptoms or regular contractions.  Fundal Height:  38 Fetal Heart rate:  140 Edema:  none  Patient is without complaints other than noted in her HPI. All questions were answered.  All lab and sonogram results have been reviewed. Comments: abnormal:    Assessment:  1.  Pregnancy at 10377w4d,  Estimated Date of Delivery: 08/08/16 :                          2.  Class A2 DM                        3.  Chronic hYpertension                        4.  AMA  Medication(s) Plans:  Continue glyburide 2.5 am 7.5 pm amlodipine 10  Treatment Plan:  Twice weekly surveillance, sonogram alternating with NST, induction at 39 weeks or as clinically indicated   Return in about 3 days (around 07/25/2016) for BPP/sono, HROB, with Dr Despina HiddenEure. for appointment for high risk OB care  No orders of the defined types were placed in this encounter.  Orders Placed This Encounter  Procedures  . US Fetal BPP W/O Non Stress  . US UA Cord Doppler  . POCT urinalysis dipstick

## 2016-07-23 ENCOUNTER — Telehealth (HOSPITAL_COMMUNITY): Payer: Self-pay | Admitting: *Deleted

## 2016-07-23 NOTE — Telephone Encounter (Signed)
Preadmission screen  

## 2016-07-25 ENCOUNTER — Ambulatory Visit (INDEPENDENT_AMBULATORY_CARE_PROVIDER_SITE_OTHER): Payer: BLUE CROSS/BLUE SHIELD | Admitting: Obstetrics & Gynecology

## 2016-07-25 ENCOUNTER — Ambulatory Visit (INDEPENDENT_AMBULATORY_CARE_PROVIDER_SITE_OTHER): Payer: BLUE CROSS/BLUE SHIELD

## 2016-07-25 ENCOUNTER — Encounter: Payer: Self-pay | Admitting: Obstetrics & Gynecology

## 2016-07-25 VITALS — BP 120/70 | HR 78 | Wt 239.0 lb

## 2016-07-25 DIAGNOSIS — O09893 Supervision of other high risk pregnancies, third trimester: Secondary | ICD-10-CM

## 2016-07-25 DIAGNOSIS — O09523 Supervision of elderly multigravida, third trimester: Secondary | ICD-10-CM

## 2016-07-25 DIAGNOSIS — Z3A38 38 weeks gestation of pregnancy: Secondary | ICD-10-CM

## 2016-07-25 DIAGNOSIS — O10912 Unspecified pre-existing hypertension complicating pregnancy, second trimester: Secondary | ICD-10-CM | POA: Diagnosis not present

## 2016-07-25 DIAGNOSIS — Z331 Pregnant state, incidental: Secondary | ICD-10-CM

## 2016-07-25 DIAGNOSIS — O10919 Unspecified pre-existing hypertension complicating pregnancy, unspecified trimester: Secondary | ICD-10-CM

## 2016-07-25 DIAGNOSIS — O24419 Gestational diabetes mellitus in pregnancy, unspecified control: Secondary | ICD-10-CM

## 2016-07-25 DIAGNOSIS — Z1389 Encounter for screening for other disorder: Secondary | ICD-10-CM

## 2016-07-25 LAB — POCT URINALYSIS DIPSTICK
GLUCOSE UA: NEGATIVE
Ketones, UA: NEGATIVE
Leukocytes, UA: NEGATIVE
NITRITE UA: NEGATIVE
RBC UA: NEGATIVE

## 2016-07-25 NOTE — Progress Notes (Signed)
   Induction Assessment Scheduling Form:     Susan Reeves                                                                                   DOB:  Apr 24, 1976                                                            MRN:  [id number]                                                                     Phone #:   [phone]                          Provider:  [facility]  GP:  [id number]                                                            Estimated Date of Delivery: 08/08/16  Dating Criteria: 1045w2d sonogram    Medical Indications for induction:  Chronic hypertension, Class A2 DM, AMA>40 Admission Date/Time:  08/01/16@0630  Gestational age on admission:  2797w0d   Filed Weights   07/25/16 1218  Weight: 239 lb (108.4 kg)   HIV:  Non Reactive (06/12 0905) GBS: Positive (08/10 1600)  Cervical exam:  0 cm dilated, 0 effaced, -2 station, cervical position post, consistency firm, Bishop score 0, presenting part vertex   Method of induction(proposed):  cytotec   Scheduling Provider Signature:  Lazaro ArmsEURE,LUTHER H, MD                                            Today's Date:  07/25/2016

## 2016-07-25 NOTE — Progress Notes (Signed)
US 38 wks,cephalic,post pl gr 3,BPP 6/8 (no breathing),fhr 180 bpm,AFI 9 cm,RI .53,.60,discussed results with Dr Despina HiddenEure.

## 2016-07-25 NOTE — Progress Notes (Signed)
Fetal Surveillance Testing today:  BPP 6/8 with good Dopplers but during the ultrasound the baby was exceedingly active which confirms overall activity, baby was so active moving never had sustained 30 sec of breathing   High Risk Pregnancy Diagnosis(es):   Class A2 DM, CHTN, AMA  G8P0070 1025w0d Estimated Date of Delivery: 08/08/16  Blood pressure 120/70, pulse 78, weight 239 lb (108.4 kg), last menstrual period 10/22/2015.  Urinalysis: Negative   HPI: The patient is being seen today for ongoing management of as above. Today she reports CBG and BP log are all good   BP weight and urine results all reviewed and noted. Patient reports good fetal movement, denies any bleeding and no rupture of membranes symptoms or regular contractions.  Fundal Height:  38 Fetal Heart rate:  155-180 Edema:  none  Patient is without complaints other than noted in her HPI. All questions were answered.  All lab and sonogram results have been reviewed. Comments: abnormal:    Assessment:  1.  Pregnancy at 325w0d,  Estimated Date of Delivery: 08/08/16 :                          2.  Class A2 DM                        3.  CHTN                       4.   AMA  Medication(s) Plans:  Glyburide 2.5 am, 7.5 pm, amlodipine 10 mg daily  Treatment Plan:  NST next week then induction scheduled 8/31  Return in about 4 days (around 07/29/2016) for NST, HROB. for appointment for high risk OB care  No orders of the defined types were placed in this encounter.  Orders Placed This Encounter  Procedures  . POCT urinalysis dipstick

## 2016-07-29 ENCOUNTER — Ambulatory Visit (INDEPENDENT_AMBULATORY_CARE_PROVIDER_SITE_OTHER): Payer: BLUE CROSS/BLUE SHIELD | Admitting: Obstetrics & Gynecology

## 2016-07-29 ENCOUNTER — Encounter: Payer: Self-pay | Admitting: Obstetrics & Gynecology

## 2016-07-29 VITALS — BP 146/90 | HR 72 | Wt 237.0 lb

## 2016-07-29 DIAGNOSIS — O09893 Supervision of other high risk pregnancies, third trimester: Secondary | ICD-10-CM

## 2016-07-29 DIAGNOSIS — O10919 Unspecified pre-existing hypertension complicating pregnancy, unspecified trimester: Secondary | ICD-10-CM

## 2016-07-29 DIAGNOSIS — O24419 Gestational diabetes mellitus in pregnancy, unspecified control: Secondary | ICD-10-CM | POA: Diagnosis not present

## 2016-07-29 DIAGNOSIS — Z1389 Encounter for screening for other disorder: Secondary | ICD-10-CM

## 2016-07-29 DIAGNOSIS — O10913 Unspecified pre-existing hypertension complicating pregnancy, third trimester: Secondary | ICD-10-CM

## 2016-07-29 DIAGNOSIS — Z331 Pregnant state, incidental: Secondary | ICD-10-CM

## 2016-07-29 DIAGNOSIS — Z3A39 39 weeks gestation of pregnancy: Secondary | ICD-10-CM

## 2016-07-29 LAB — POCT URINALYSIS DIPSTICK
Glucose, UA: NEGATIVE
KETONES UA: NEGATIVE
Leukocytes, UA: NEGATIVE
Nitrite, UA: NEGATIVE
RBC UA: NEGATIVE

## 2016-07-29 NOTE — Progress Notes (Signed)
Fetal Surveillance Testing today:  Reactive NST   High Risk Pregnancy Diagnosis(es):   Class A2 DM< chronic hypertension  G8P0070 2725w4d Estimated Date of Delivery: 08/08/16  Blood pressure (!) 146/90, pulse 72, weight 237 lb (107.5 kg), last menstrual period 10/22/2015.  Urinalysis: Positive for trace protein   HPI: The patient is being seen today for ongoing management of as above. Today she reports CBG are good BP stable   BP weight and urine results all reviewed and noted. Patient reports good fetal movement, denies any bleeding and no rupture of membranes symptoms or regular contractions.  Fundal Height:  38 Fetal Heart rate:  135 Edema:  none  Patient is without complaints other than noted in her HPI. All questions were answered.  All lab and sonogram results have been reviewed. Comments: abnormal: 2 hour   Assessment:  1.  Pregnancy at 5125w4d,  Estimated Date of Delivery: 08/08/16 :                          2.  Class A2 DM                        3.  CHTN  Medication(s) Plans:  Induction this Thursday 08/01/16  Treatment Plan:  Continue glyuride 2.5 am 7.5 pm amlodipine 10   Return in about 2 weeks (around 08/12/2016) for Follow up, with Dr Despina HiddenEure. for appointment for high risk OB care  No orders of the defined types were placed in this encounter.  Orders Placed This Encounter  Procedures  . POCT urinalysis dipstick

## 2016-08-01 ENCOUNTER — Inpatient Hospital Stay (HOSPITAL_COMMUNITY)
Admission: AD | Admit: 2016-08-01 | Discharge: 2016-08-04 | DRG: 983 | Disposition: A | Discharge status: Home / Self Care | Payer: BLUE CROSS/BLUE SHIELD | Source: Ambulatory Visit | Attending: Obstetrics & Gynecology | Admitting: Obstetrics & Gynecology

## 2016-08-01 ENCOUNTER — Encounter (HOSPITAL_COMMUNITY): Payer: Self-pay

## 2016-08-01 DIAGNOSIS — O99334 Smoking (tobacco) complicating childbirth: Secondary | ICD-10-CM | POA: Diagnosis present

## 2016-08-01 DIAGNOSIS — O24419 Gestational diabetes mellitus in pregnancy, unspecified control: Secondary | ICD-10-CM | POA: Diagnosis present

## 2016-08-01 DIAGNOSIS — O09893 Supervision of other high risk pregnancies, third trimester: Secondary | ICD-10-CM

## 2016-08-01 DIAGNOSIS — L918 Other hypertrophic disorders of the skin: Secondary | ICD-10-CM | POA: Diagnosis present

## 2016-08-01 DIAGNOSIS — O1002 Pre-existing essential hypertension complicating childbirth: Principal | ICD-10-CM | POA: Diagnosis present

## 2016-08-01 DIAGNOSIS — O10919 Unspecified pre-existing hypertension complicating pregnancy, unspecified trimester: Secondary | ICD-10-CM | POA: Diagnosis present

## 2016-08-01 DIAGNOSIS — O24425 Gestational diabetes mellitus in childbirth, controlled by oral hypoglycemic drugs: Secondary | ICD-10-CM | POA: Diagnosis present

## 2016-08-01 DIAGNOSIS — O99284 Endocrine, nutritional and metabolic diseases complicating childbirth: Secondary | ICD-10-CM | POA: Diagnosis present

## 2016-08-01 DIAGNOSIS — O09899 Supervision of other high risk pregnancies, unspecified trimester: Secondary | ICD-10-CM

## 2016-08-01 DIAGNOSIS — E079 Disorder of thyroid, unspecified: Secondary | ICD-10-CM | POA: Diagnosis present

## 2016-08-01 DIAGNOSIS — O99824 Streptococcus B carrier state complicating childbirth: Secondary | ICD-10-CM | POA: Diagnosis present

## 2016-08-01 DIAGNOSIS — O09523 Supervision of elderly multigravida, third trimester: Secondary | ICD-10-CM

## 2016-08-01 DIAGNOSIS — F17210 Nicotine dependence, cigarettes, uncomplicated: Secondary | ICD-10-CM | POA: Diagnosis present

## 2016-08-01 DIAGNOSIS — Z3A39 39 weeks gestation of pregnancy: Secondary | ICD-10-CM | POA: Diagnosis not present

## 2016-08-01 LAB — CBC
HEMATOCRIT: 37.5 % (ref 36.0–46.0)
HEMOGLOBIN: 13.2 g/dL (ref 12.0–15.0)
MCH: 32.4 pg (ref 26.0–34.0)
MCHC: 35.2 g/dL (ref 30.0–36.0)
MCV: 92.1 fL (ref 78.0–100.0)
Platelets: 242 10*3/uL (ref 150–400)
RBC: 4.07 MIL/uL (ref 3.87–5.11)
RDW: 13.7 % (ref 11.5–15.5)
WBC: 8.2 10*3/uL (ref 4.0–10.5)

## 2016-08-01 LAB — GLUCOSE, RANDOM: GLUCOSE: 129 mg/dL — AB (ref 65–99)

## 2016-08-01 LAB — GLUCOSE, CAPILLARY
GLUCOSE-CAPILLARY: 116 mg/dL — AB (ref 65–99)
Glucose-Capillary: 114 mg/dL — ABNORMAL HIGH (ref 65–99)
Glucose-Capillary: 91 mg/dL (ref 65–99)
Glucose-Capillary: 84 mg/dL (ref 65–99)
Glucose-Capillary: 107 mg/dL — ABNORMAL HIGH (ref 65–99)
Glucose-Capillary: 129 mg/dL — ABNORMAL HIGH (ref 65–99)

## 2016-08-01 LAB — TYPE AND SCREEN
ABO/RH(D): A POS
Antibody Screen: NEGATIVE

## 2016-08-01 LAB — HIV ANTIBODY (ROUTINE TESTING W REFLEX): HIV SCREEN 4TH GENERATION: NONREACTIVE

## 2016-08-01 LAB — RPR: RPR Ser Ql: NONREACTIVE

## 2016-08-01 LAB — ABO/RH: ABO/RH(D): A POS

## 2016-08-01 MED ORDER — PENICILLIN G POTASSIUM 5000000 UNITS IJ SOLR
2.5000 10*6.[IU] | INTRAVENOUS | Status: DC
  Administered 2016-08-01 – 2016-08-02 (×8): 2.5 10*6.[IU] via INTRAVENOUS
  Filled 2016-08-01 (×10): qty 2.5

## 2016-08-01 MED ORDER — OXYTOCIN BOLUS FROM INFUSION: 500.0000 mL | Freq: Once | INTRAVENOUS | Status: DC

## 2016-08-01 MED ORDER — LACTATED RINGERS IV SOLN: 500.0000 mL | INTRAVENOUS | Status: DC | PRN

## 2016-08-01 MED ORDER — PENICILLIN G POTASSIUM 5000000 UNITS IJ SOLR
5.0000 10*6.[IU] | Freq: Once | INTRAVENOUS | Status: AC
  Administered 2016-08-01: 5 10*6.[IU] via INTRAVENOUS
  Filled 2016-08-01: qty 5

## 2016-08-01 MED ORDER — LACTATED RINGERS IV SOLN
INTRAVENOUS | Status: DC
  Administered 2016-08-01 – 2016-08-02 (×4): via INTRAVENOUS

## 2016-08-01 MED ORDER — OXYCODONE-ACETAMINOPHEN 5-325 MG PO TABS: 1.0000 | ORAL_TABLET | ORAL | Status: DC | PRN

## 2016-08-01 MED ORDER — OXYCODONE-ACETAMINOPHEN 5-325 MG PO TABS: 2.0000 | ORAL_TABLET | ORAL | Status: DC | PRN

## 2016-08-01 MED ORDER — TERBUTALINE SULFATE 1 MG/ML IJ SOLN: 0.2500 mg | Freq: Once | INTRAMUSCULAR | Status: DC | PRN

## 2016-08-01 MED ORDER — OXYTOCIN 40 UNITS IN LACTATED RINGERS INFUSION - SIMPLE MED
1.0000 m[IU]/min | INTRAVENOUS | Status: DC
Start: 2016-08-01 — End: 2016-08-02
  Administered 2016-08-02: 2 m[IU]/min via INTRAVENOUS

## 2016-08-01 MED ORDER — FLEET ENEMA 7-19 GM/118ML RE ENEM: 1.0000 | ENEMA | RECTAL | Status: DC | PRN

## 2016-08-01 MED ORDER — OXYTOCIN 40 UNITS IN LACTATED RINGERS INFUSION - SIMPLE MED
2.5000 [IU]/h | INTRAVENOUS | Status: DC
  Administered 2016-08-02: 2.5 [IU]/h via INTRAVENOUS
  Filled 2016-08-01: qty 1000

## 2016-08-01 MED ORDER — ONDANSETRON HCL 4 MG/2ML IJ SOLN: 4.0000 mg | Freq: Four times a day (QID) | INTRAMUSCULAR | Status: DC | PRN

## 2016-08-01 MED ORDER — SOD CITRATE-CITRIC ACID 500-334 MG/5ML PO SOLN: 30.0000 mL | ORAL | Status: DC | PRN

## 2016-08-01 MED ORDER — GLYBURIDE 2.5 MG PO TABS
2.5000 mg | ORAL_TABLET | Freq: Once | ORAL | Status: AC
  Administered 2016-08-02: 2.5 mg via ORAL
  Filled 2016-08-01: qty 1

## 2016-08-01 MED ORDER — ACETAMINOPHEN 325 MG PO TABS: 650.0000 mg | ORAL_TABLET | ORAL | Status: DC | PRN

## 2016-08-01 MED ORDER — LIDOCAINE HCL (PF) 1 % IJ SOLN
30.0000 mL | INTRAMUSCULAR | Status: AC | PRN
  Administered 2016-08-02: 30 mL via SUBCUTANEOUS
  Filled 2016-08-01: qty 30

## 2016-08-01 MED ORDER — AMLODIPINE BESYLATE 10 MG PO TABS
10.0000 mg | ORAL_TABLET | Freq: Every day | ORAL | Status: DC
  Administered 2016-08-02 – 2016-08-03 (×3): 10 mg via ORAL
  Filled 2016-08-01 (×4): qty 1

## 2016-08-01 MED ORDER — MISOPROSTOL 25 MCG QUARTER TABLET
25.0000 ug | ORAL_TABLET | ORAL | Status: DC | PRN
  Administered 2016-08-01 (×2): 25 ug via VAGINAL
  Filled 2016-08-01 (×2): qty 0.25

## 2016-08-01 NOTE — H&P (Signed)
LABOR ADMISSION HISTORY AND PHYSICAL  Ariellah Faust is a 40 y.o. female G8P0070 with IUP at [redacted]w[redacted]d by 6wk sono presenting for IOL for A2DM and cHTN. She reports +FMs, No LOF, and no VB.  She plans on breast feeding. She request condoms for birth control.  Dating: By Jeni Salles --->  Estimated Date of Delivery: 08/08/16  Prenatal History/Complications:  Past Medical History: Past Medical History:  Diagnosis Date  . AMA (advanced maternal age) multigravida 35+ 12/06/2015  . Gestational diabetes   . History of HPV infection   . 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 Pregnant 12/06/2015  . Smoker 12/06/2015  . 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 THYROID SURGERY      Obstetrical History: OB History    Gravida Para Term Preterm AB Living   8 0 0 0 7 0   SAB TAB Ectopic Multiple Live Births   6 1 0 0 0      Social History: Social History   Social History  . Marital status: Married    Spouse name: N/A  . Number of children: N/A  . Years of education: N/A   Social History Main Topics  . Smoking status: Current Every Day Smoker    Packs/day: 0.50    Years: 26.00    Types: Cigarettes  . Smokeless tobacco: Never Used  . Alcohol use No     Comment: not now  . Drug use: No  . Sexual activity: Yes    Birth control/ protection: None   Other Topics Concern  . None   Social History Narrative  . None    Family History: Family History  Problem Relation Age of Onset  . Diabetes Mother   . Stroke Mother   . Thyroid disease Mother   . Diabetes Father   . Hyperlipidemia Father   . Diabetes Maternal Grandmother   . Cancer Maternal Grandmother     stomach  . Diabetes Paternal Grandmother   . Heart attack Paternal Grandfather     Allergies: Allergies  Allergen Reactions  . Labetalol Other (See Comments)    Nipples on fire  . Other     Oranges-rash    Prescriptions Prior to Admission   Medication Sig Dispense Refill Last Dose  . amLODipine (NORVASC) 10 MG tablet Take 1 tablet (10 mg total) by mouth daily. 30 tablet 11 07/31/2016 at 2200  . aspirin EC 81 MG tablet Take 1 tablet (81 mg total) by mouth daily. 30 tablet 6 07/31/2016 at Unknown time  . calcium carbonate 1250 MG capsule Take 1,500 mg by mouth daily.   07/31/2016 at Unknown time  . glyBURIDE (DIABETA) 2.5 MG tablet Take 1 tablet in am 3 tablets in the pm (Patient taking differently: Take 7.5 mg by mouth every evening. ) 120 tablet 3 07/31/2016 at Unknown time  . prenatal vitamin w/FE, FA (PRENATAL 1 + 1) 27-1 MG TABS tablet Take 1 tablet by mouth daily at 12 noon.   07/31/2016 at Unknown time     Review of Systems   All systems reviewed and negative except as stated in HPI  Blood pressure (!) 147/84, pulse 84, temperature 98.3 F (36.8 C), temperature source Oral, resp. rate 18, height 5\' 5"  (1.651 m), weight 107.5 kg (237 lb), last menstrual period 10/22/2015, SpO2 98 %. General appearance: alert and cooperative Lungs: clear to auscultation bilaterally Heart: regular rate  and rhythm Abdomen: soft, non-tender; bowel sounds normal Extremities: Homans sign is negative, no sign of DVT Presentation: cephalic (confirmed with bedside US) Fetal monitoringBaseline: 135 bpm, Variability: Good {> 6 bpm), Accelerations: Reactive and Decelerations: Absent Uterine activity: Irritability  Dilation: Fingertip Effacement (%): Thick Station: -2 Exam by:: D Herr rn   Prenatal labs: ABO, Rh: A/Positive/-- (01/19 1606) Antibody: Negative (06/12 0905) Rubella: !Error! RPR: Non Reactive (06/12 0905)  HBsAg: Negative (01/19 1606)  HIV: Non Reactive (06/12 0905)  GBS: Positive (08/10 1600)  2 hr abnormal: 102/224/139 Genetic screening  Negative  Anatomy US: limited heart view on first scan; repeat at 24 weeks normal   Prenatal Transfer Tool  Maternal Diabetes: Yes:  Diabetes Type:  Insulin/Medication controlled Genetic  Screening: Normal Maternal Ultrasounds/Referrals: Normal Fetal Ultrasounds or other Referrals:  None Maternal Substance Abuse:  Yes:  Type: Smoker Significant Maternal Medications:  Meds include: Other: Glyburide and Amlodipine  Significant Maternal Lab Results: Lab values include: Group B Strep positive  Results for orders placed or performed during the hospital encounter of 08/01/16 (from the past 24 hour(s))  CBC   Collection Time: 08/01/16  8:00 AM  Result Value Ref Range   WBC 8.2 4.0 - 10.5 K/uL   RBC 4.07 3.87 - 5.11 MIL/uL   Hemoglobin 13.2 12.0 - 15.0 g/dL   HCT 16.137.5 09.636.0 - 04.546.0 %   MCV 92.1 78.0 - 100.0 fL   MCH 32.4 26.0 - 34.0 pg   MCHC 35.2 30.0 - 36.0 g/dL   RDW 40.913.7 81.111.5 - 91.415.5 %   Platelets 242 150 - 400 K/uL    Patient Active Problem List   Diagnosis Date Noted  . Gestational diabetes mellitus (GDM) affecting pregnancy, antepartum (HCC) 08/01/2016  . Gestational diabetes mellitus, class A2 05/14/2016  . Chronic hypertension during pregnancy, antepartum 12/21/2015  . Supervision of other high-risk pregnancy 12/06/2015  . 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 AMA (advanced maternal age) 6440+ 12/06/2015  . Smoker 12/06/2015    Assessment: Lucy ChrisJoan Sanderson is a 40 y.o. G8P0070 at 5168w0d here for IOL for A2DM and cHTN.   #Labor: Place cytotec  #Pain: Epidural upon maternal request when labor progressed  #FWB: Cat I  #ID:  GBS+, prophylaxis with PCN  #MOF: breast #MOC: condoms #Circ:  N/A   De HollingsheadCatherine L Wallace 08/01/2016, 9:22 AM   OB FELLOW HISTORY AND PHYSICAL ATTESTATION  I have seen and examined this patient; I agree with above documentation in the resident's note.    Jen MowElizabeth Makell Cyr, DO OB Fellow 08/01/2016, 11:29 PM

## 2016-08-01 NOTE — Anesthesia Pain Management Evaluation Note (Signed)
  CRNA Pain Management Visit Note  Patient: Susan Reeves, 40 y.o., female  "Hello I am a [facility] the [facility]. We have an anesthesia team available at all times to provide care throughout the hospital, including epidural management and anesthesia for C-section. I don't know your plan for the delivery whether it a natural birth, water birth, IV sedation, nitrous supplementation, doula or epidural, but we want to meet your pain goals."   1.Was your pain managed to your expectations on prior hospitalizations?     2.What is your expectation for pain management during this hospitalization?     3.How can we help you reach that goal?   Record the patient's initial score and the patient's pain goal.   Pain: 2  Pain Goal: 8 [facility] wants you to be able to say your pain was always managed very well.  Laban EmperorMalinova,Ruther Ephraim Hristova 08/01/2016

## 2016-08-01 NOTE — Progress Notes (Signed)
Patient ID: Susan Reeves, female   DOB: 1976-07-03, 40 y.o.   MRN: [id number] Pt resting, desires reg diet, to eat reg food SVE: 2/50/-3 Foley bulb placed without diff EFM: 145, mod variability, +accel, -decel Plan: NSVD  CRESENZO-DISHMAN,Aidric Endicott

## 2016-08-02 ENCOUNTER — Inpatient Hospital Stay (HOSPITAL_COMMUNITY): Payer: BLUE CROSS/BLUE SHIELD | Admitting: Anesthesiology

## 2016-08-02 ENCOUNTER — Encounter (HOSPITAL_COMMUNITY): Payer: Self-pay

## 2016-08-02 DIAGNOSIS — O99334 Smoking (tobacco) complicating childbirth: Secondary | ICD-10-CM

## 2016-08-02 DIAGNOSIS — O99824 Streptococcus B carrier state complicating childbirth: Secondary | ICD-10-CM

## 2016-08-02 DIAGNOSIS — O1002 Pre-existing essential hypertension complicating childbirth: Secondary | ICD-10-CM

## 2016-08-02 DIAGNOSIS — O24425 Gestational diabetes mellitus in childbirth, controlled by oral hypoglycemic drugs: Secondary | ICD-10-CM

## 2016-08-02 DIAGNOSIS — O99284 Endocrine, nutritional and metabolic diseases complicating childbirth: Secondary | ICD-10-CM

## 2016-08-02 DIAGNOSIS — Z3A39 39 weeks gestation of pregnancy: Secondary | ICD-10-CM

## 2016-08-02 LAB — GLUCOSE, CAPILLARY
GLUCOSE-CAPILLARY: 96 mg/dL (ref 65–99)
GLUCOSE-CAPILLARY: 76 mg/dL (ref 65–99)
GLUCOSE-CAPILLARY: 81 mg/dL (ref 65–99)
GLUCOSE-CAPILLARY: 138 mg/dL — AB (ref 65–99)
GLUCOSE-CAPILLARY: 85 mg/dL (ref 65–99)
Glucose-Capillary: 182 mg/dL — ABNORMAL HIGH (ref 65–99)
Glucose-Capillary: 91 mg/dL (ref 65–99)
Glucose-Capillary: 99 mg/dL (ref 65–99)
Glucose-Capillary: 99 mg/dL (ref 65–99)

## 2016-08-02 LAB — CBC
HCT: 37.3 % (ref 36.0–46.0)
Hemoglobin: 13 g/dL (ref 12.0–15.0)
MCH: 32.8 pg (ref 26.0–34.0)
MCHC: 34.9 g/dL (ref 30.0–36.0)
MCV: 94.2 fL (ref 78.0–100.0)
Platelets: 221 10*3/uL (ref 150–400)
RBC: 3.96 MIL/uL (ref 3.87–5.11)
RDW: 13.9 % (ref 11.5–15.5)
WBC: 9.2 10*3/uL (ref 4.0–10.5)

## 2016-08-02 MED ORDER — PRENATAL MULTIVITAMIN CH
1.0000 | ORAL_TABLET | Freq: Every day | ORAL | Status: DC
  Filled 2016-08-02 (×2): qty 1

## 2016-08-02 MED ORDER — SENNOSIDES-DOCUSATE SODIUM 8.6-50 MG PO TABS
2.0000 | ORAL_TABLET | ORAL | Status: DC
  Administered 2016-08-02: 2 via ORAL
  Filled 2016-08-02 (×2): qty 2

## 2016-08-02 MED ORDER — LACTATED RINGERS IV SOLN
INTRAVENOUS | Status: DC
  Administered 2016-08-02: 150 mL/h via INTRAUTERINE

## 2016-08-02 MED ORDER — DIPHENHYDRAMINE HCL 25 MG PO CAPS: 25.0000 mg | ORAL_CAPSULE | Freq: Four times a day (QID) | ORAL | Status: DC | PRN

## 2016-08-02 MED ORDER — DIBUCAINE 1 % RE OINT
1.0000 "application " | TOPICAL_OINTMENT | RECTAL | Status: DC | PRN
  Filled 2016-08-02: qty 28

## 2016-08-02 MED ORDER — PHENYLEPHRINE 40 MCG/ML (10ML) SYRINGE FOR IV PUSH (FOR BLOOD PRESSURE SUPPORT): 80.0000 ug | PREFILLED_SYRINGE | INTRAVENOUS | Status: DC | PRN

## 2016-08-02 MED ORDER — WITCH HAZEL-GLYCERIN EX PADS: 1.0000 "application " | MEDICATED_PAD | CUTANEOUS | Status: DC | PRN

## 2016-08-02 MED ORDER — EPHEDRINE 5 MG/ML INJ: 10.0000 mg | INTRAVENOUS | Status: DC | PRN

## 2016-08-02 MED ORDER — FENTANYL 2.5 MCG/ML BUPIVACAINE 1/10 % EPIDURAL INFUSION (WH - ANES): 14.0000 mL/h | INTRAMUSCULAR | Status: DC | PRN

## 2016-08-02 MED ORDER — TETANUS-DIPHTH-ACELL PERTUSSIS 5-2.5-18.5 LF-MCG/0.5 IM SUSP
0.5000 mL | Freq: Once | INTRAMUSCULAR | Status: DC
  Filled 2016-08-02: qty 0.5

## 2016-08-02 MED ORDER — LIDOCAINE HCL (PF) 1 % IJ SOLN
INTRAMUSCULAR | Status: DC | PRN
  Administered 2016-08-02 (×2): 5 mL

## 2016-08-02 MED ORDER — BENZOCAINE-MENTHOL 20-0.5 % EX AERO
1.0000 "application " | INHALATION_SPRAY | CUTANEOUS | Status: DC | PRN
  Administered 2016-08-02: 1 via TOPICAL
  Filled 2016-08-02: qty 56

## 2016-08-02 MED ORDER — PHENYLEPHRINE 40 MCG/ML (10ML) SYRINGE FOR IV PUSH (FOR BLOOD PRESSURE SUPPORT)
80.0000 ug | PREFILLED_SYRINGE | INTRAVENOUS | Status: DC | PRN
  Filled 2016-08-02: qty 10

## 2016-08-02 MED ORDER — FENTANYL 2.5 MCG/ML BUPIVACAINE 1/10 % EPIDURAL INFUSION (WH - ANES)
14.0000 mL/h | INTRAMUSCULAR | Status: DC | PRN
  Administered 2016-08-02 (×2): 14 mL/h via EPIDURAL
  Filled 2016-08-02 (×2): qty 125

## 2016-08-02 MED ORDER — ONDANSETRON HCL 4 MG/2ML IJ SOLN: 4.0000 mg | INTRAMUSCULAR | Status: DC | PRN

## 2016-08-02 MED ORDER — DIPHENHYDRAMINE HCL 50 MG/ML IJ SOLN: 12.5000 mg | INTRAMUSCULAR | Status: DC | PRN

## 2016-08-02 MED ORDER — ACETAMINOPHEN 325 MG PO TABS: 650.0000 mg | ORAL_TABLET | ORAL | Status: DC | PRN

## 2016-08-02 MED ORDER — SIMETHICONE 80 MG PO CHEW: 80.0000 mg | CHEWABLE_TABLET | ORAL | Status: DC | PRN

## 2016-08-02 MED ORDER — LACTATED RINGERS IV SOLN: 500.0000 mL | Freq: Once | INTRAVENOUS | Status: DC

## 2016-08-02 MED ORDER — ONDANSETRON HCL 4 MG PO TABS: 4.0000 mg | ORAL_TABLET | ORAL | Status: DC | PRN

## 2016-08-02 MED ORDER — COCONUT OIL OIL: 1.0000 "application " | TOPICAL_OIL | Status: DC | PRN

## 2016-08-02 MED ORDER — ZOLPIDEM TARTRATE 5 MG PO TABS: 5.0000 mg | ORAL_TABLET | Freq: Every evening | ORAL | Status: DC | PRN

## 2016-08-02 MED ORDER — IBUPROFEN 600 MG PO TABS
600.0000 mg | ORAL_TABLET | Freq: Four times a day (QID) | ORAL | Status: DC
  Administered 2016-08-02 – 2016-08-04 (×8): 600 mg via ORAL
  Filled 2016-08-02 (×8): qty 1

## 2016-08-02 NOTE — Anesthesia Preprocedure Evaluation (Signed)
Anesthesia Evaluation    Airway Mallampati: II       Dental no notable dental hx.    Pulmonary Current Smoker,    Pulmonary exam normal        Cardiovascular hypertension, Normal cardiovascular exam     Neuro/Psych    GI/Hepatic   Endo/Other  diabetes, GestationalHypothyroidism   Renal/GU      Musculoskeletal   Abdominal   Peds  Hematology   Anesthesia Other Findings   Reproductive/Obstetrics                             Anesthesia Physical Anesthesia Plan  ASA: III  Anesthesia Plan: Epidural   Post-op Pain Management:    Induction:   Airway Management Planned:   Additional Equipment:   Intra-op Plan:   Post-operative Plan:   Informed Consent:   Plan Discussed with:   Anesthesia Plan Comments:         Anesthesia Quick Evaluation

## 2016-08-02 NOTE — Anesthesia Procedure Notes (Signed)
Epidural Patient location during procedure: OB Start time: 08/02/2016 3:10 AM End time: 08/02/2016 3:17 AM  Staffing Anesthesiologist: Bonita QuinGUIDETTI, Manahil Vanzile S Performed: anesthesiologist   Preanesthetic Checklist Completed: patient identified, site marked, surgical consent, pre-op evaluation, timeout performed, IV checked, risks and benefits discussed and monitors and equipment checked  Epidural Patient position: sitting Prep: site prepped and draped and DuraPrep Patient monitoring: continuous pulse ox and blood pressure Approach: midline Injection technique: LOR air  Needle:  Needle type: Tuohy  Needle gauge: 17 G Needle length: 9 cm and 9 Needle insertion depth: 7 cm Catheter type: closed end flexible Catheter size: 19 Gauge Catheter at skin depth: 12 cm Test dose: negative  Assessment Events: blood not aspirated, injection not painful, no injection resistance, negative IV test and no paresthesia

## 2016-08-02 NOTE — Progress Notes (Signed)
Dr Regan Lemmingenneney phone to discuss if repeat cbc needed to pull epidural catheter.  Dr. Regan Lemmingenneney agreed not to repeat cbc

## 2016-08-02 NOTE — Progress Notes (Signed)
Patient ID: Susan Reeves, female   DOB: 1976-11-14, 40 y.o.   MRN: [id number] Pt ate reg dinner, having irregular contractions Foley bulb came out @ 2350,  SVE:4-5/70/-3 EFM: 140, moderate variability, +accel, -decel Plan: begin pitocin augmentation NSVD  CRESENZO-DISHMAN,Ephram Kornegay

## 2016-08-02 NOTE — Progress Notes (Signed)
Patient ID: Susan Reeves, female   DOB: July 23, 1976, 40 y.o.   MRN: [id number] EFM: 140, Moderate variability, Variable and occasional late decels,  SVE: 5-6/80/-1 AROM, moderate mec IUPC and FSE placed without difficulty Pt repositioned to right lateral Pitocin off,  Plan amnioinfusion

## 2016-08-03 LAB — GLUCOSE, CAPILLARY: GLUCOSE-CAPILLARY: 102 mg/dL — AB (ref 65–99)

## 2016-08-03 NOTE — Progress Notes (Signed)
Post Partum Day 1 Subjective: no complaints, up ad lib, voiding, tolerating PO and + flatus  Objective: Blood pressure 134/71, pulse 78, temperature 98.5 F (36.9 C), temperature source Oral, resp. rate 18, height 5\' 5"  (1.651 m), weight 107.5 kg (237 lb), last menstrual period 10/22/2015, SpO2 96 %, unknown if currently breastfeeding.  Physical Exam:  General: alert, cooperative and no distress Lochia: appropriate Uterine Fundus: firm DVT Evaluation: No evidence of DVT seen on physical exam. Negative Homan's sign.   Recent Labs  08/01/16 0800 08/02/16 0026  HGB 13.2 13.0  HCT 37.5 37.3    Assessment/Plan: Plan for discharge tomorrow   LOS: 2 days   Leland HerElsia J Yoo PGY-1 08/03/2016, 8:05 AM   CNM attestation Post Partum Day #1 I have seen and examined this patient and agree with above documentation in the resident's note.   Lucy ChrisJoan Loss is a 40 y.o. [id number] s/p SVD.  Pt denies problems with ambulating, voiding or po intake. Pain is well controlled.  Plan for birth control is condoms.  Method of Feeding: breast. On Vasotec 10gm.  PE:  BP (!) 157/83 (BP Location: Right Arm)   Pulse 80   Temp 98.3 F (36.8 C) (Oral)   Resp 18   Ht 5\' 5"  (1.651 m)   Wt 107.5 kg (237 lb)   LMP 10/22/2015   SpO2 96%   Breastfeeding? Unknown   BMI 39.44 kg/m  Fundus firm  Plan for discharge: 08/04/16  Cam HaiSHAW, Kinzy Weyers, CNM 12:12 PM

## 2016-08-03 NOTE — Anesthesia Postprocedure Evaluation (Signed)
Anesthesia Post Note  Patient: Lucy ChrisJoan Nasby  Procedure(s) Performed: * No procedures listed *  Patient location during evaluation: Mother Baby Anesthesia Type: Epidural Level of consciousness: awake and alert Pain management: pain level controlled Vital Signs Assessment: post-procedure vital signs reviewed and stable Respiratory status: spontaneous breathing and nonlabored ventilation Cardiovascular status: stable Postop Assessment: no headache, patient able to bend at knees, no backache, no signs of nausea or vomiting, epidural receding and adequate PO intake Anesthetic complications: no     Last Vitals:  Vitals:   08/02/16 2330 08/03/16 0526  BP: (!) 118/56 134/71  Pulse: 75 78  Resp: 16 18  Temp: 36.7 C 36.9 C    Last Pain:  Vitals:   08/03/16 0616  TempSrc:   PainSc: 2    Pain Goal:                 Laban EmperorMalinova,Ashton Sabine Hristova

## 2016-08-03 NOTE — Lactation Note (Signed)
This note was copied from a baby's chart. Lactation Consultation Note    Initial consult with this first time mom of an SGA term baby, birth weight 5 lbs 14.4 oz. The baby is now 6120 hours old, and has latched for short periods on and off. At this time, the baby is sleepy, and mom has baby skin to skin. I observed mom latch [place] multiple times, but [place] only suckled some, and slept. Mom has expressible colostrum, and drops were finger fed to [place]. She latched wide, with flanged lips, with EBM on nipple. The baby has had more than adequate wet and dirty diapers.  I set up DEP and instructed mom in it's use, and advised her to pump if after 3 hours, she can not get Lois to feed, to pump both breast for 15 minutes, followed by hand expression. Mom knows she can finger feed drops, but if larger amount obtained, she can bottle feed or call for other options to lactation. Breast fedding teaching done from the Baby andMe book, as well as lactation services. Mom knows to call for questions/concerns.   Patient Name: [name] Today's Date: 08/03/2016    Maternal Data    Feeding Feeding Type: Breast Fed  LATCH Score/Interventions Latch: Repeated attempts needed to sustain latch, nipple held in mouth throughout feeding, stimulation needed to elicit sucking reflex. (few suckles, mostly sleeping) Intervention(s): Skin to skin;Teach feeding cues;Waking techniques Intervention(s): Adjust position;Assist with latch  Intervention(s): Hand expression  Type of Nipple: Everted at rest and after stimulation  Comfort (Breast/Nipple): Soft / non-tender     Hold (Positioning): Assistance needed to correctly position infant at breast and maintain latch. Intervention(s): Breastfeeding basics reviewed;Support Pillows;Position options;Skin to skin     Lactation Tools Discussed/Used Pump Review: Setup, frequency, and cleaning;Milk Storage;Other (comment) (pump settings, review of Baby and Me book  breasatfeeding pages, hand expression) Initiated by:: Danton Claphristine Tu Shimmel, RN, IBCLC Date initiated:: 08/03/16   Consult Status Consult Status: Follow-up Date: 08/04/16 Follow-up type: In-patient    Alfred LevinsLee, Chloe Flis Anne 08/03/2016, 1:50 PM

## 2016-08-04 MED ORDER — IBUPROFEN 600 MG PO TABS: 600.0000 mg | ORAL_TABLET | Freq: Four times a day (QID) | ORAL | 0 refills | Status: DC

## 2016-08-04 NOTE — Discharge Summary (Signed)
OB Discharge Summary  Patient Name: Susan Reeves DOB: Oct 27, 1976 MRN: [id number]  Date of admission: 08/01/2016 Delivering MD: Frederik PearEGELE, JULIE P   Date of discharge: 08/04/2016  Admitting diagnosis: INDUCTION Intrauterine pregnancy: 7157w1d     Secondary diagnosis:Principal Problem:   Gestational diabetes mellitus, class A2 Active Problems:   Supervision of other high-risk pregnancy   Chronic hypertension during pregnancy, antepartum   Gestational diabetes mellitus (GDM) affecting pregnancy, antepartum (HCC)  Additional problems:CHTN, GDM     Discharge diagnosis: Term Pregnancy Delivered, CHTN and GDM A2                                                                     Post partum procedures:n/a  Augmentation: n/a  Complications: None  Hospital course:  Onset of Labor With Vaginal Delivery     40 y.o. yo [id number] at 6957w1d was admitted in Active Labor on 08/01/2016. Patient had an uncomplicated labor course as follows:  Membrane Rupture Time/Date: 8:27 AM ,08/02/2016   Intrapartum Procedures: Episiotomy: None [1]                                         Lacerations:  None [1]  Patient had a delivery of a Viable infant. 08/02/2016  Information for the patient's newborn:  Maree ErieStewart, Girl Keyaria [[id number]][[id number]]  Delivery Method: Vaginal, Spontaneous Delivery (Filed from Delivery Summary)    Pateint had an uncomplicated postpartum course.  She is ambulating, tolerating a regular diet, passing flatus, and urinating well. Patient is discharged home in stable condition on 08/04/16.    Physical exam Vitals:   08/02/16 2330 08/03/16 0526 08/03/16 1700 08/04/16 0647  BP: (!) 118/56 134/71 140/75 (!) 157/83  Pulse: 75 78 76 80  Resp: 16 18 18 18   Temp: 98.1 F (36.7 C) 98.5 F (36.9 C) 98.3 F (36.8 C) 98.3 F (36.8 C)  TempSrc: Oral Oral Oral Oral  SpO2:      Weight:      Height:       General: alert, cooperative and no distress Lochia: appropriate Uterine Fundus: firm Incision:  N/A DVT Evaluation: No evidence of DVT seen on physical exam. Negative Homan's sign. Labs: Lab Results  Component Value Date   WBC 9.2 08/02/2016   HGB 13.0 08/02/2016   HCT 37.3 08/02/2016   MCV 94.2 08/02/2016   PLT 221 08/02/2016   CMP Latest Ref Rng & Units 08/01/2016  Glucose 65 - 99 mg/dL 696(E129(H)  BUN 6 - 24 mg/dL -  Creatinine 9.520.57 - 8.411.00 mg/dL -  Sodium 324134 - 401144 mmol/L -  Potassium 3.5 - 5.2 mmol/L -  Chloride 96 - 106 mmol/L -  CO2 18 - 29 mmol/L -  Calcium 8.7 - 10.2 mg/dL -  Total Protein 6.0 - 8.5 g/dL -  Total Bilirubin 0.0 - 1.2 mg/dL -  Alkaline Phos 39 - 027117 IU/L -  AST 0 - 40 IU/L -  ALT 0 - 32 IU/L -    Discharge instruction: per After Visit Summary and "Baby and Me Booklet".    Diet: carb modified diet  Activity: Advance as tolerated. Pelvic rest for  6 weeks.   Outpatient follow up:6 weeks Follow up Appt:Future Appointments Date Time Provider Department Center  08/13/2016 11:15 AM Lazaro Arms, MD [facility]   Follow up visit: No Follow-up on file.  Postpartum contraception: Condoms  Newborn Data: Live born female  Birth Weight: 5 lb 14.4 oz (2675 g) APGAR: 9, 9  Baby Feeding: Breast Disposition:home with mother   08/04/2016 Wyvonnia Dusky, CNM

## 2016-08-04 NOTE — Lactation Note (Signed)
This note was copied from a baby's chart. Lactation Consultation Note  Patient Name: Susan Reeves Date: 08/04/2016 Reason for consult: Follow-up assessment   With this mom of a term baby, now 2440 hours old. The baby was fussy in her crib, and dad gave baby a pacifier. Mom had just otld me she was exhausted. I gently reminded her to try and avoid a pacifier until baby well established at breast feeding, at bout 292-343 weeks of age. I reviewed breast/engorgement care. And told mom to call lactation as needed.    Maternal Data    Feeding Feeding Type: Breast Fed Length of feed: 15 min  LATCH Score/Interventions Latch: Grasps breast easily, tongue down, lips flanged, rhythmical sucking.  Audible Swallowing: A few with stimulation  Type of Nipple: Everted at rest and after stimulation  Comfort (Breast/Nipple): Soft / non-tender     Hold (Positioning): No assistance needed to correctly position infant at breast.  LATCH Score: 9  Lactation Tools Discussed/Used     Consult Status Consult Status: Complete Follow-up type: Call as needed    Alfred LevinsLee, Bairon Klemann Anne 08/04/2016, 9:44 AM

## 2016-08-13 ENCOUNTER — Encounter: Payer: Self-pay | Admitting: Obstetrics & Gynecology

## 2016-08-13 ENCOUNTER — Ambulatory Visit (INDEPENDENT_AMBULATORY_CARE_PROVIDER_SITE_OTHER): Payer: BLUE CROSS/BLUE SHIELD | Admitting: Obstetrics & Gynecology

## 2016-08-13 VITALS — BP 160/100 | HR 76 | Wt 218.0 lb

## 2016-08-13 DIAGNOSIS — I10 Essential (primary) hypertension: Secondary | ICD-10-CM | POA: Diagnosis not present

## 2016-08-13 DIAGNOSIS — Z0130 Encounter for examination of blood pressure without abnormal findings: Secondary | ICD-10-CM

## 2016-08-13 NOTE — Progress Notes (Signed)
Blood pressure (!) 160/100, pulse 76, weight 218 lb (98.9 kg), last menstrual period 10/22/2015, unknown if currently breastfeeding.   Did not take BP meds this am, newborn baby and all  BP log from home is good Follow up 4 weeks pp exam     Face to face time:  15 minutes  Greater than 50% of the visit time was spent in counseling and coordination of care with the patient.  The summary and outline of the counseling and care coordination is summarized in the note above.   All questions were answered.

## 2016-09-10 ENCOUNTER — Encounter: Payer: Self-pay | Admitting: Obstetrics & Gynecology

## 2016-09-10 ENCOUNTER — Ambulatory Visit (INDEPENDENT_AMBULATORY_CARE_PROVIDER_SITE_OTHER): Payer: BLUE CROSS/BLUE SHIELD | Admitting: Obstetrics & Gynecology

## 2016-09-10 NOTE — Progress Notes (Signed)
Subjective:     Susan Reeves is a 40 y.o. female who presents for a postpartum visit. She is 6 weeks postpartum following a spontaneous vaginal delivery. I have fully reviewed the prenatal and intrapartum course. The delivery was at 39  gestational weeks. Outcome: spontaneous vaginal delivery. Anesthesia: epidural. Postpartum course has been normal. Baby's course has been colic. Baby is feeding by bottle. Bleeding staining only. Bowel function is normal. Bladder function is normal. Patient is sexually active. Contraception method is condoms. Postpartum depression screening: negative.  The following portions of the patient's history were reviewed and updated as appropriate: allergies, current medications, past family history, past medical history, past social history, past surgical history and problem list.  Review of Systems Pertinent items are noted in HPI.   Objective:    BP 140/90   Pulse 80   Wt 217 lb (98.4 kg)   LMP 10/22/2015   BMI 36.11 kg/m   General:  alert, cooperative and no distress   Breasts:    Lungs:   Heart:    Abdomen: soft, non-tender; bowel sounds normal; no masses,  no organomegaly   Vulva:  normal  Vagina: normal vagina, no discharge, exudate, lesion, or erythema  Cervix:  no cervical motion tenderness and no lesions  Corpus: normal size, contour, position, consistency, mobility, non-tender  Adnexa:  normal adnexa  Rectal Exam: Not performed.        Assessment:     normal postpartum exam. Pap smear not done at today's visit.   Plan:    1. Contraception: condoms 2.  3. Follow up in: 6 months or as needed.

## 2018-12-18 ENCOUNTER — Ambulatory Visit (INDEPENDENT_AMBULATORY_CARE_PROVIDER_SITE_OTHER): Payer: Managed Care, Other (non HMO) | Admitting: Adult Health

## 2018-12-18 ENCOUNTER — Encounter: Payer: Self-pay | Admitting: Adult Health

## 2018-12-18 VITALS — BP 161/79 | HR 87 | Ht 65.0 in | Wt 238.6 lb

## 2018-12-18 DIAGNOSIS — O09521 Supervision of elderly multigravida, first trimester: Secondary | ICD-10-CM | POA: Insufficient documentation

## 2018-12-18 DIAGNOSIS — N96 Recurrent pregnancy loss: Secondary | ICD-10-CM

## 2018-12-18 DIAGNOSIS — O3680X0 Pregnancy with inconclusive fetal viability, not applicable or unspecified: Secondary | ICD-10-CM | POA: Insufficient documentation

## 2018-12-18 DIAGNOSIS — F17200 Nicotine dependence, unspecified, uncomplicated: Secondary | ICD-10-CM | POA: Diagnosis not present

## 2018-12-18 DIAGNOSIS — Z3201 Encounter for pregnancy test, result positive: Secondary | ICD-10-CM

## 2018-12-18 DIAGNOSIS — N926 Irregular menstruation, unspecified: Secondary | ICD-10-CM

## 2018-12-18 DIAGNOSIS — I10 Essential (primary) hypertension: Secondary | ICD-10-CM

## 2018-12-18 DIAGNOSIS — Z3A01 Less than 8 weeks gestation of pregnancy: Secondary | ICD-10-CM | POA: Insufficient documentation

## 2018-12-18 LAB — POCT URINE PREGNANCY: PREG TEST UR: POSITIVE — AB

## 2018-12-18 MED ORDER — AMLODIPINE BESYLATE 5 MG PO TABS: 5.0000 mg | ORAL_TABLET | Freq: Every day | ORAL | 6 refills | Status: DC

## 2018-12-18 NOTE — Progress Notes (Signed)
Patient ID: Susan Reeves, female   DOB: August 18, 1976, 43 y.o.   MRN: [id number] History of Present Illness: Susan Reeves is a 43 year old white female, in for UPT, has missed a period and had 3+HPTs.She has had multiple miscarriages and had to use progesterone last pregnancy. Had gestational diabetes with last pregnancy, and had to take BP meds during pregnancy.  No PCP   Current Medications, Allergies, Past Medical History, Past Surgical History, Family History and Social History were reviewed in [employer] record.     Review of Systems:  +missed period, with +HPT x 3  Has tooth pain at times, was going to get dentures with tax money    Physical Exam:BP (!) 161/79 (BP Location: Left Arm, Patient Position: Sitting, Cuff Size: Large)   Pulse 87   Ht 5\' 5"  (1.651 m)   Wt 238 lb 9.6 oz (108.2 kg)   LMP 11/17/2018 (Exact Date)   BMI 39.71 kg/m   UPT +, about 4+3 weeks by LMP with EDD 08/24/2019. General:  Well developed, well nourished, no acute distress Skin:  Warm and dry Neck:  Midline trachea, normal thyroid, good ROM, no lymphadenopathy Lungs; Clear to auscultation bilaterally Cardiovascular: Regular rate and rhythm Abdomen:  Soft, non tender Psych:  No mood changes, alert and cooperative,seems happy Fall risk is low.PHQ 2 score 0. Will check labs and start on BP meds.  Impression: 1. Pregnancy test positive   2. Less than [redacted] weeks gestation of pregnancy   3. Encounter to determine fetal viability of pregnancy, single or unspecified fetus   4. Multigravida of advanced maternal age in first trimester   5. Smoker   6. History of multiple miscarriages   7. Hypertension, unspecified type       Plan: Check QHCG and progesterone. Continue PNV Return in 2 weeks for dating [place] and 3 weeks for New OB Monitor BP at home Review handouts on First trimester and by [facility]  Ok to take tylenol and see dentist Try to stop smoking

## 2018-12-18 NOTE — Patient Instructions (Signed)
First Trimester of Pregnancy  The first trimester of pregnancy is from week 1 until the end of week 13 (months 1 through 3). A week after a sperm fertilizes an egg, the egg will implant on the wall of the uterus. This embryo will begin to develop into a baby. Genes from you and your partner will form the baby. The female genes will determine whether the baby will be a boy or a girl. At 6-8 weeks, the eyes and face will be formed, and the heartbeat can be seen on ultrasound. At the end of 12 weeks, all the baby's organs will be formed.  Now that you are pregnant, you will want to do everything you can to have a healthy baby. Two of the most important things are to get good prenatal care and to follow your health care provider's instructions. Prenatal care is all the medical care you receive before the baby's birth. This care will help prevent, find, and treat any problems during the pregnancy and childbirth.  Body changes during your first trimester  Your body goes through many changes during pregnancy. The changes vary from woman to woman.   You may gain or lose a couple of pounds at first.   You may feel sick to your stomach (nauseous) and you may throw up (vomit). If the vomiting is uncontrollable, call your health care provider.   You may tire easily.   You may develop headaches that can be relieved by medicines. All medicines should be approved by your health care provider.   You may urinate more often. Painful urination may mean you have a bladder infection.   You may develop heartburn as a result of your pregnancy.   You may develop constipation because certain hormones are causing the muscles that push stool through your intestines to slow down.   You may develop hemorrhoids or swollen veins (varicose veins).   Your breasts may begin to grow larger and become tender. Your nipples may stick out more, and the tissue that surrounds them (areola) may become darker.   Your gums may bleed and may be  sensitive to brushing and flossing.   Dark spots or blotches (chloasma, mask of pregnancy) may develop on your face. This will likely fade after the baby is born.   Your menstrual periods will stop.   You may have a loss of appetite.   You may develop cravings for certain kinds of food.   You may have changes in your emotions from day to day, such as being excited to be pregnant or being concerned that something may go wrong with the pregnancy and baby.   You may have more vivid and strange dreams.   You may have changes in your hair. These can include thickening of your hair, rapid growth, and changes in texture. Some women also have hair loss during or after pregnancy, or hair that feels dry or thin. Your hair will most likely return to normal after your baby is born.  What to expect at prenatal visits  During a routine prenatal visit:   You will be weighed to make sure you and the baby are growing normally.   Your blood pressure will be taken.   Your abdomen will be measured to track your baby's growth.   The fetal heartbeat will be listened to between weeks 10 and 14 of your pregnancy.   Test results from any previous visits will be discussed.  Your health care provider may ask you:     How you are feeling.   If you are feeling the baby move.   If you have had any abnormal symptoms, such as leaking fluid, bleeding, severe headaches, or abdominal cramping.   If you are using any tobacco products, including cigarettes, chewing tobacco, and electronic cigarettes.   If you have any questions.  Other tests that may be performed during your first trimester include:   Blood tests to find your blood type and to check for the presence of any previous infections. The tests will also be used to check for low iron levels (anemia) and protein on red blood cells (Rh antibodies). Depending on your risk factors, or if you previously had diabetes during pregnancy, you may have tests to check for high blood sugar  that affects pregnant women (gestational diabetes).   Urine tests to check for infections, diabetes, or protein in the urine.   An ultrasound to confirm the proper growth and development of the baby.   Fetal screens for spinal cord problems (spina bifida) and Down syndrome.   HIV (human immunodeficiency virus) testing. Routine prenatal testing includes screening for HIV, unless you choose not to have this test.   You may need other tests to make sure you and the baby are doing well.  Follow these instructions at home:  Medicines   Follow your health care provider's instructions regarding medicine use. Specific medicines may be either safe or unsafe to take during pregnancy.   Take a prenatal vitamin that contains at least 600 micrograms (mcg) of folic acid.   If you develop constipation, try taking a stool softener if your health care provider approves.  Eating and drinking     Eat a balanced diet that includes fresh fruits and vegetables, whole grains, good sources of protein such as meat, eggs, or tofu, and low-fat dairy. Your health care provider will help you determine the amount of weight gain that is right for you.   Avoid raw meat and uncooked cheese. These carry germs that can cause birth defects in the baby.   Eating four or five small meals rather than three large meals a day may help relieve nausea and vomiting. If you start to feel nauseous, eating a few soda crackers can be helpful. Drinking liquids between meals, instead of during meals, also seems to help ease nausea and vomiting.   Limit foods that are high in fat and processed sugars, such as fried and sweet foods.   To prevent constipation:  ? Eat foods that are high in fiber, such as fresh fruits and vegetables, whole grains, and beans.  ? Drink enough fluid to keep your urine clear or pale yellow.  Activity   Exercise only as directed by your health care provider. Most women can continue their usual exercise routine during  pregnancy. Try to exercise for 30 minutes at least 5 days a week. Exercising will help you:  ? Control your weight.  ? Stay in shape.  ? Be prepared for labor and delivery.   Experiencing pain or cramping in the lower abdomen or lower back is a good sign that you should stop exercising. Check with your health care provider before continuing with normal exercises.   Try to avoid standing for long periods of time. Move your legs often if you must stand in one place for a long time.   Avoid heavy lifting.   Wear low-heeled shoes and practice good posture.   You may continue to have sex unless your health care   provider tells you not to.  Relieving pain and discomfort   Wear a good support bra to relieve breast tenderness.   Take warm sitz baths to soothe any pain or discomfort caused by hemorrhoids. Use hemorrhoid cream if your health care provider approves.   Rest with your legs elevated if you have leg cramps or low back pain.   If you develop varicose veins in your legs, wear support hose. Elevate your feet for 15 minutes, 3-4 times a day. Limit salt in your diet.  Prenatal care   Schedule your prenatal visits by the twelfth week of pregnancy. They are usually scheduled monthly at first, then more often in the last 2 months before delivery.   Write down your questions. Take them to your prenatal visits.   Keep all your prenatal visits as told by your health care provider. This is important.  Safety   Wear your seat belt at all times when driving.   Make a list of emergency phone numbers, including numbers for family, friends, the hospital, and police and fire departments.  General instructions   Ask your health care provider for a referral to a local prenatal education class. Begin classes no later than the beginning of month 6 of your pregnancy.   Ask for help if you have counseling or nutritional needs during pregnancy. Your health care provider can offer advice or refer you to specialists for help  with various needs.   Do not use hot tubs, steam rooms, or saunas.   Do not douche or use tampons or scented sanitary pads.   Do not cross your legs for long periods of time.   Avoid cat litter boxes and soil used by cats. These carry germs that can cause birth defects in the baby and possibly loss of the fetus by miscarriage or stillbirth.   Avoid all smoking, herbs, alcohol, and medicines not prescribed by your health care provider. Chemicals in these products affect the formation and growth of the baby.   Do not use any products that contain nicotine or tobacco, such as cigarettes and e-cigarettes. If you need help quitting, ask your health care provider. You may receive counseling support and other resources to help you quit.   Schedule a dentist appointment. At home, brush your teeth with a soft toothbrush and be gentle when you floss.  Contact a health care provider if:   You have dizziness.   You have mild pelvic cramps, pelvic pressure, or nagging pain in the abdominal area.   You have persistent nausea, vomiting, or diarrhea.   You have a bad smelling vaginal discharge.   You have pain when you urinate.   You notice increased swelling in your face, hands, legs, or ankles.   You are exposed to fifth disease or chickenpox.   You are exposed to German measles (rubella) and have never had it.  Get help right away if:   You have a fever.   You are leaking fluid from your vagina.   You have spotting or bleeding from your vagina.   You have severe abdominal cramping or pain.   You have rapid weight gain or loss.   You vomit blood or material that looks like coffee grounds.   You develop a severe headache.   You have shortness of breath.   You have any kind of trauma, such as from a fall or a car accident.  Summary   The first trimester of pregnancy is from week 1 until   the end of week 13 (months 1 through 3).   Your body goes through many changes during pregnancy. The changes vary from  woman to woman.   You will have routine prenatal visits. During those visits, your health care provider will examine you, discuss any test results you may have, and talk with you about how you are feeling.  This information is not intended to replace advice given to you by your health care provider. Make sure you discuss any questions you have with your health care provider.  Document Released: 11/12/2001 Document Revised: 10/30/2016 Document Reviewed: 10/30/2016  Elsevier Interactive Patient Education  2019 [employer]

## 2018-12-19 LAB — BETA HCG QUANT (REF LAB): HCG QUANT: 31 m[IU]/mL

## 2018-12-19 LAB — PROGESTERONE: PROGESTERONE: 24.9 ng/mL

## 2018-12-21 ENCOUNTER — Telehealth: Payer: Self-pay | Admitting: Adult Health

## 2018-12-21 DIAGNOSIS — Z3490 Encounter for supervision of normal pregnancy, unspecified, unspecified trimester: Secondary | ICD-10-CM

## 2018-12-21 NOTE — Telephone Encounter (Signed)
Patient called, would like lab results.  [phone]

## 2018-12-21 NOTE — Telephone Encounter (Signed)
DOB verified. Informed pt that Hcg level is rising and progesterone level is good. Advised that we would check another Hcg in the morning. Pt verbalized understanding.

## 2018-12-22 ENCOUNTER — Other Ambulatory Visit: Payer: Managed Care, Other (non HMO)

## 2018-12-23 ENCOUNTER — Telehealth: Payer: Self-pay | Admitting: Adult Health

## 2018-12-23 LAB — BETA HCG QUANT (REF LAB): hCG Quant: 50 m[IU]/mL

## 2018-12-23 NOTE — Telephone Encounter (Signed)
Patient called, would like lab results. ° °[phone] °

## 2018-12-23 NOTE — Telephone Encounter (Signed)
Pt aware number is rising but not doubling. Need to recheck in the am. Pt placed on lab schedule. Pt voiced understanding. JSY

## 2018-12-24 ENCOUNTER — Other Ambulatory Visit: Payer: Managed Care, Other (non HMO)

## 2018-12-24 DIAGNOSIS — Z3490 Encounter for supervision of normal pregnancy, unspecified, unspecified trimester: Secondary | ICD-10-CM

## 2018-12-25 ENCOUNTER — Telehealth: Payer: Self-pay | Admitting: Adult Health

## 2018-12-25 DIAGNOSIS — O039 Complete or unspecified spontaneous abortion without complication: Secondary | ICD-10-CM

## 2018-12-25 LAB — BETA HCG QUANT (REF LAB): HCG QUANT: 15 m[IU]/mL

## 2018-12-25 NOTE — Telephone Encounter (Signed)
Pt aware that QHCG dropped to 15, recheck next week and will cancel [place] and new OB appt

## 2018-12-25 NOTE — Telephone Encounter (Signed)
Calling for test results ?

## 2019-01-01 ENCOUNTER — Other Ambulatory Visit: Payer: Managed Care, Other (non HMO)

## 2019-01-08 ENCOUNTER — Ambulatory Visit: Payer: Managed Care, Other (non HMO) | Admitting: *Deleted

## 2019-01-08 ENCOUNTER — Encounter: Payer: Managed Care, Other (non HMO) | Admitting: Women's Health

## 2019-12-09 ENCOUNTER — Other Ambulatory Visit: Payer: Self-pay

## 2019-12-09 ENCOUNTER — Ambulatory Visit: Payer: Managed Care, Other (non HMO) | Attending: Internal Medicine

## 2019-12-09 DIAGNOSIS — Z20822 Contact with and (suspected) exposure to covid-19: Secondary | ICD-10-CM

## 2019-12-10 LAB — NOVEL CORONAVIRUS, NAA: SARS-CoV-2, NAA: NOT DETECTED

## 2020-02-01 ENCOUNTER — Telehealth: Payer: Self-pay | Admitting: Adult Health

## 2020-02-01 NOTE — Telephone Encounter (Signed)

## 2020-02-02 ENCOUNTER — Other Ambulatory Visit: Payer: Self-pay

## 2020-02-02 ENCOUNTER — Other Ambulatory Visit (HOSPITAL_COMMUNITY)
Admission: RE | Admit: 2020-02-02 | Discharge: 2020-02-02 | Disposition: A | Discharge status: Home / Self Care | Payer: Managed Care, Other (non HMO) | Source: Ambulatory Visit | Attending: Adult Health | Admitting: Adult Health

## 2020-02-02 ENCOUNTER — Ambulatory Visit (INDEPENDENT_AMBULATORY_CARE_PROVIDER_SITE_OTHER): Payer: Managed Care, Other (non HMO) | Admitting: Adult Health

## 2020-02-02 ENCOUNTER — Encounter: Payer: Self-pay | Admitting: Adult Health

## 2020-02-02 VITALS — BP 154/90 | HR 96 | Ht 65.0 in | Wt 236.0 lb

## 2020-02-02 DIAGNOSIS — Z3202 Encounter for pregnancy test, result negative: Secondary | ICD-10-CM | POA: Diagnosis not present

## 2020-02-02 DIAGNOSIS — N926 Irregular menstruation, unspecified: Secondary | ICD-10-CM | POA: Insufficient documentation

## 2020-02-02 DIAGNOSIS — Z1212 Encounter for screening for malignant neoplasm of rectum: Secondary | ICD-10-CM

## 2020-02-02 DIAGNOSIS — Z01419 Encounter for gynecological examination (general) (routine) without abnormal findings: Secondary | ICD-10-CM | POA: Insufficient documentation

## 2020-02-02 DIAGNOSIS — L0292 Furuncle, unspecified: Secondary | ICD-10-CM | POA: Insufficient documentation

## 2020-02-02 DIAGNOSIS — N96 Recurrent pregnancy loss: Secondary | ICD-10-CM

## 2020-02-02 DIAGNOSIS — Z1211 Encounter for screening for malignant neoplasm of colon: Secondary | ICD-10-CM | POA: Diagnosis not present

## 2020-02-02 LAB — HEMOCCULT GUIAC POC 1CARD (OFFICE): Fecal Occult Blood, POC: NEGATIVE

## 2020-02-02 LAB — POCT URINE PREGNANCY: Preg Test, Ur: NEGATIVE

## 2020-02-02 NOTE — Progress Notes (Signed)
Patient ID: Susan Reeves, female   DOB: 1975-12-25, 44 y.o.   MRN: [id number] History of Present Illness: Susan Reeves is a 44 year old white female, married, [id number], in for a well woman gyn exam and pap.She has missed a period and had 3+HPTs.     Current Medications, Allergies, Past Medical History, Past Surgical History, Family History and Social History were reviewed in [employer] record.     Review of Systems:  Patient denies any headaches, hearing loss, fatigue, blurred vision, shortness of breath, chest pain, abdominal pain, problems with bowel movements, urination, or intercourse. No joint pain or mood swings. Missed period and had 3+HPTs,  Has sore right breast  Physical Exam:BP (!) 154/90 (BP Location: Left Arm, Patient Position: Sitting, Cuff Size: Large)   Pulse 96   Ht 5\' 5"  (1.651 m)   Wt 236 lb (107 kg)   LMP 01/05/2020 (Exact Date)   Breastfeeding Unknown   BMI 39.27 kg/m UPT negative and after about 15 minutes faint + General:  Well developed, well nourished, no acute distress Skin:  Warm and dry Neck:  Midline trachea, normal thyroid, good ROM, no lymphadenopathy Lungs; Clear to auscultation bilaterally Breast:  No dominant palpable mass, retraction, or nipple discharge, has healing boil at about 7 0' clock right breast Cardiovascular: Regular rate and rhythm Abdomen:  Soft, non tender, no hepatosplenomegaly Pelvic:  External genitalia is normal in appearance, no lesions.  The vagina is normal in appearance. Urethra has no lesions or masses. The cervix is bulbous.Pap with GC/CHL and high risk HPV 16/18 genotyping.  Uterus is felt to be normal size, shape, and contour.  No adnexal masses or tenderness noted.Bladder is non tender, no masses felt. Rectal: Good sphincter tone, no polyps, or hemorrhoids felt.  Hemoccult negative. Extremities/musculoskeletal:  No swelling or varicosities noted, no clubbing or cyanosis Psych:  No mood changes, alert and  cooperative,seems happy Fall risk is low PHQ 2 score 0. Examination chaperoned by 03/04/2020 LPN  Impression and Plan: 1. Urine pregnancy test negative  2. Encounter for gynecological examination with Papanicolaou smear of cervix Pap sent Physical in 1 year Pap in 3 if normal Will get mammogram if not pregnant  3. Screening for colorectal cancer   4. Missed period Check QHCG and progesterone Take OTC PNV She wants her tubes tied if not pregnant   5. Boil Just watch for now  6. History of multiple miscarriages

## 2020-02-02 NOTE — Addendum Note (Signed)
Addended by: Lynnell Dike on: 02/02/2020 10:08 AM   Modules accepted: Orders

## 2020-02-03 ENCOUNTER — Telehealth: Payer: Self-pay | Admitting: *Deleted

## 2020-02-03 DIAGNOSIS — Z3201 Encounter for pregnancy test, result positive: Secondary | ICD-10-CM

## 2020-02-03 LAB — BETA HCG QUANT (REF LAB): hCG Quant: 7 m[IU]/mL

## 2020-02-03 LAB — PROGESTERONE: Progesterone: 1.1 ng/mL

## 2020-02-03 NOTE — Addendum Note (Signed)
Addended by: Cyril Mourning A on: 02/03/2020 05:24 PM   Modules accepted: Orders

## 2020-02-03 NOTE — Telephone Encounter (Signed)
Pt aware that QHCG 7 and progesterone 1.1, will recheck QHCG in am but with progesterone this low, I think this is chemical pregnancy.

## 2020-02-03 NOTE — Telephone Encounter (Signed)
Patient left message wanting lab results. Would like Cyril Mourning, NP to call.

## 2020-02-04 LAB — CYTOLOGY - PAP
Adequacy: ABSENT
Chlamydia: NEGATIVE
Comment: NEGATIVE
Comment: NEGATIVE
Comment: NORMAL
Diagnosis: NEGATIVE
High risk HPV: NEGATIVE
Neisseria Gonorrhea: NEGATIVE

## 2020-02-05 LAB — BETA HCG QUANT (REF LAB): hCG Quant: 5 m[IU]/mL

## 2020-02-18 ENCOUNTER — Emergency Department (HOSPITAL_COMMUNITY)
Admission: EM | Admit: 2020-02-18 | Discharge: 2020-02-18 | Disposition: A | Discharge status: Home / Self Care | Payer: Managed Care, Other (non HMO) | Attending: Emergency Medicine | Admitting: Emergency Medicine

## 2020-02-18 ENCOUNTER — Encounter (HOSPITAL_COMMUNITY): Payer: Self-pay | Admitting: Emergency Medicine

## 2020-02-18 ENCOUNTER — Emergency Department (HOSPITAL_COMMUNITY): Payer: Managed Care, Other (non HMO)

## 2020-02-18 ENCOUNTER — Other Ambulatory Visit: Payer: Self-pay

## 2020-02-18 DIAGNOSIS — Y939 Activity, unspecified: Secondary | ICD-10-CM | POA: Diagnosis not present

## 2020-02-18 DIAGNOSIS — S9031XA Contusion of right foot, initial encounter: Secondary | ICD-10-CM | POA: Diagnosis not present

## 2020-02-18 DIAGNOSIS — W228XXA Striking against or struck by other objects, initial encounter: Secondary | ICD-10-CM | POA: Diagnosis not present

## 2020-02-18 DIAGNOSIS — Y929 Unspecified place or not applicable: Secondary | ICD-10-CM | POA: Diagnosis not present

## 2020-02-18 DIAGNOSIS — Y999 Unspecified external cause status: Secondary | ICD-10-CM | POA: Insufficient documentation

## 2020-02-18 DIAGNOSIS — S99921A Unspecified injury of right foot, initial encounter: Secondary | ICD-10-CM | POA: Diagnosis present

## 2020-02-18 DIAGNOSIS — F17210 Nicotine dependence, cigarettes, uncomplicated: Secondary | ICD-10-CM | POA: Insufficient documentation

## 2020-02-18 MED ORDER — IBUPROFEN 800 MG PO TABS: 800.0000 mg | ORAL_TABLET | Freq: Three times a day (TID) | ORAL | 0 refills | Status: DC

## 2020-02-18 NOTE — Discharge Instructions (Addendum)
Elevate and apply ice packs on and off to your foot.  Take the ibuprofen if needed for pain.  You may contact the podiatrist listed in 1 week if your symptoms are not improving.

## 2020-02-18 NOTE — ED Provider Notes (Signed)
[facility] EMERGENCY DEPARTMENT Provider Note   CSN: [id number] Arrival date & time: 02/18/20  1351     History Chief Complaint  Patient presents with  . Foot Pain    Susan Reeves is a 44 y.o. female.  HPI      Susan Reeves is a 44 y.o. female who presents to the Emergency Department complaining of pain and increasing swelling of the right foot.  She states that she accidentally struck her right foot on the door of a storage shed.  Incident occurred 4 days ago.  She describes having gradually worsening pain upon ambulation and she has noted continued swelling to her dorsal foot.  She denies pain to her ankle, numbness or tingling of her toes, or pain to the hindfoot.  No alleviating factors.   Past Medical History:  Diagnosis Date  . AMA (advanced maternal age) multigravida 35+ 12/06/2015  . Gestational diabetes   . History of HPV infection   . 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 Pregnant 12/06/2015  . Smoker 12/06/2015  . 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 Boil 02/02/2020  . Missed period 02/02/2020  . Screening for colorectal cancer 02/02/2020  . Urine pregnancy test negative 02/02/2020  . Encounter for gynecological examination with Papanicolaou smear of cervix 02/02/2020  . Multigravida of advanced maternal age in first trimester 12/18/2018  . Encounter to determine fetal viability of pregnancy 12/18/2018  . Less than [redacted] weeks gestation of pregnancy 12/18/2018  . Pregnancy test positive 12/18/2018  . Hypertension 12/18/2018  . Gestational diabetes mellitus (GDM) affecting pregnancy, antepartum 08/01/2016  . Gestational diabetes mellitus, class A2 05/14/2016  . Chronic hypertension during pregnancy, antepartum 12/21/2015  . Supervision of other high-risk pregnancy 12/06/2015  . 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 AMA (advanced maternal age) 40+ 12/06/2015  . Smoker 12/06/2015    Past  Surgical History:  Procedure Laterality Date  . THYROID SURGERY       OB History    Gravida  9   Para  1   Term  1   Preterm  0   AB  7   Living  1     SAB  6   TAB  1   Ectopic  0   Multiple  0   Live Births  1           Family History  Problem Relation Age of Onset  . Diabetes Mother   . Stroke Mother   . Thyroid disease Mother   . Diabetes Father   . Hyperlipidemia Father   . Diabetes Maternal Grandmother   . Cancer Maternal Grandmother        stomach  . Diabetes Paternal Grandmother   . Heart attack Paternal Grandfather     Social History   Tobacco Use  . Smoking status: Current Every Day Smoker    Packs/day: 1.00    Years: 26.00    Pack years: 26.00    Types: Cigarettes  . Smokeless tobacco: Never Used  Substance Use Topics  . Alcohol use: No    Comment: not now  . Drug use: No    Home Medications Prior to Admission medications   Not on File    Allergies    Labetalol and Other  Review of Systems   Review of Systems  Constitutional: Negative for chills and fever.  Respiratory: Negative for shortness of  breath and wheezing.   Cardiovascular: Negative for chest pain.  Gastrointestinal: Negative for nausea and vomiting.  Musculoskeletal: Positive for arthralgias (Right foot pain and swelling) and joint swelling.  Skin: Negative for color change and wound.  Neurological: Negative for syncope, weakness and numbness.    Physical Exam Updated Vital Signs BP (!) 151/73   Pulse 81   Temp 98.5 F (36.9 C) (Oral)   Resp 17   Ht 5\' 5"  (1.651 m)   Wt 107 kg   SpO2 97%   BMI 39.27 kg/m   Physical Exam Vitals and nursing note reviewed.  Constitutional:      General: She is not in acute distress.    Appearance: Normal appearance. She is not ill-appearing.  HENT:     Head: Atraumatic.  Cardiovascular:     Rate and Rhythm: Normal rate and regular rhythm.     Pulses: Normal pulses.  Pulmonary:     Effort: Pulmonary effort is  normal.     Breath sounds: Normal breath sounds.  Musculoskeletal:        General: Swelling, tenderness and signs of injury present.     Comments: Tenderness to palpation along the lateral aspect of the right foot.  There is mild to moderate edema of the dorsal foot.  No tenderness to the lateral malleolus.  Achilles tendon appears intact.  Toes are nontender and no nail injuries are seen. No ecchymosis. Compartments are soft  Skin:    General: Skin is warm.     Capillary Refill: Capillary refill takes less than 2 seconds.     Findings: No bruising, erythema or rash.  Neurological:     General: No focal deficit present.     Mental Status: She is alert.     Sensory: No sensory deficit.     Motor: No weakness.     ED Results / Procedures / Treatments   Labs (all labs ordered are listed, but only abnormal results are displayed) Labs Reviewed - No data to display  EKG None  Radiology DG Foot Complete Right  Result Date: 02/18/2020 CLINICAL DATA:  Dorsal foot pain after injury EXAM: RIGHT FOOT COMPLETE - 3+ VIEW COMPARISON:  None. FINDINGS: There is no evidence of fracture or dislocation. Small bidirectional calcaneal enthesophytes. There is no evidence of arthropathy or other focal bone abnormality. Soft tissues are unremarkable. IMPRESSION: Negative. Electronically Signed   By: 02/20/2020 D.O.   On: 02/18/2020 15:07    Procedures Procedures (including critical care time)  Medications Ordered in ED Medications - No data to display  ED Course  I have reviewed the triage vital signs and the nursing notes.  Pertinent labs & imaging results that were available during my care of the patient were reviewed by me and considered in my medical decision making (see chart for details).    MDM Rules/Calculators/A&P                      Patient with pain and swelling of the right foot after a direct blow.  No open wound.  X-ray is negative for fracture or dislocation.  NV intact.   Likely contusion.  Patient agrees to symptomatic treatment and podiatry follow-up if needed.   Final Clinical Impression(s) / ED Diagnoses Final diagnoses:  Contusion of right foot, initial encounter    Rx / DC Orders ED Discharge Orders    None       02/20/2020, PA-C 02/18/20 1539  Vanetta Mulders, MD 02/19/20 [phone]

## 2020-02-18 NOTE — ED Triage Notes (Signed)
Pt had a door hit her right foot on Monday. Pain worsening and swelling

## 2020-09-27 ENCOUNTER — Ambulatory Visit (INDEPENDENT_AMBULATORY_CARE_PROVIDER_SITE_OTHER): Payer: Managed Care, Other (non HMO) | Admitting: Adult Health

## 2020-09-27 ENCOUNTER — Other Ambulatory Visit: Payer: Self-pay

## 2020-09-27 ENCOUNTER — Encounter: Payer: Self-pay | Admitting: Adult Health

## 2020-09-27 VITALS — BP 155/85 | HR 92 | Ht 65.5 in | Wt 236.0 lb

## 2020-09-27 DIAGNOSIS — Z3201 Encounter for pregnancy test, result positive: Secondary | ICD-10-CM | POA: Diagnosis not present

## 2020-09-27 DIAGNOSIS — N96 Recurrent pregnancy loss: Secondary | ICD-10-CM | POA: Diagnosis not present

## 2020-09-27 DIAGNOSIS — F17200 Nicotine dependence, unspecified, uncomplicated: Secondary | ICD-10-CM

## 2020-09-27 DIAGNOSIS — Z3490 Encounter for supervision of normal pregnancy, unspecified, unspecified trimester: Secondary | ICD-10-CM | POA: Diagnosis not present

## 2020-09-27 DIAGNOSIS — I10 Essential (primary) hypertension: Secondary | ICD-10-CM

## 2020-09-27 DIAGNOSIS — O09521 Supervision of elderly multigravida, first trimester: Secondary | ICD-10-CM

## 2020-09-27 LAB — POCT URINE PREGNANCY: Preg Test, Ur: POSITIVE — AB

## 2020-09-27 NOTE — Progress Notes (Signed)
  Subjective:     Patient ID: Susan Reeves, female   DOB: 1976-04-28, 44 y.o.   MRN: [id number]  HPI Susan Reeves is a 44 year old white female,married, in for UPT, she had period 09/06/20 and did HPT today that was +.She has history of multiple miscarriages.   Review of Systems +HPT, no missed period Reviewed past medical,surgical, social and family history. Reviewed medications and allergies.     Objective:   Physical Exam BP (!) 155/85 (BP Location: Left Arm, Patient Position: Sitting, Cuff Size: Large)   Pulse 92   Ht 5' 5.5" (1.664 m)   Wt 236 lb (107 kg)   LMP 09/06/2020   Breastfeeding No   BMI 38.68 kg/m UPT +, about 3 weeks by LMP with EDD 06/13/21.Skin warm and dry. Neck: mid line trachea, normal thyroid, good ROM, no lymphadenopathy noted. Lungs: clear to ausculation bilaterally. Cardiovascular: regular rate and rhythm. Fall risk is low  Upstream - 09/27/20 1530      Pregnancy Intention Screening   Does the patient want to become pregnant in the next year? N/A    Does the patient's partner want to become pregnant in the next year? N/A    Would the patient like to discuss contraceptive options today? N/A      Contraception Wrap Up   Current Method Pregnant/Seeking Pregnancy    End Method Pregnant/Seeking Pregnancy    Contraception Counseling Provided No             Assessment:     1. Pregnancy examination or test, positive result Take 2 flintstones daily   2. Pregnancy, unspecified gestational age Check QHCG and progesterone today and order given to check QHCG in 48 hours   3. History of multiple miscarriages  4. Multigravida of advanced maternal age in first trimester  5. Hypertension, unspecified type Will add ASA at 12 weeks   6. Smoker Start decreasing smoking     Plan:     Handout by [facility] given Call Marylu Lund in am for labs Will schedule [place] accordingly

## 2020-09-28 LAB — BETA HCG QUANT (REF LAB): hCG Quant: 47 m[IU]/mL

## 2020-09-28 LAB — PROGESTERONE: Progesterone: 14.7 ng/mL

## 2020-09-29 ENCOUNTER — Telehealth: Payer: Self-pay | Admitting: Adult Health

## 2020-09-29 NOTE — Telephone Encounter (Signed)
Patient has questions regarding progesterone levels.

## 2020-09-29 NOTE — Telephone Encounter (Signed)
Left message that progesterone is good

## 2020-09-30 LAB — BETA HCG QUANT (REF LAB): hCG Quant: 71 m[IU]/mL

## 2020-10-06 ENCOUNTER — Other Ambulatory Visit: Payer: Managed Care, Other (non HMO)

## 2020-10-06 DIAGNOSIS — N96 Recurrent pregnancy loss: Secondary | ICD-10-CM

## 2020-10-06 DIAGNOSIS — Z3201 Encounter for pregnancy test, result positive: Secondary | ICD-10-CM

## 2020-10-07 LAB — PROGESTERONE: Progesterone: 7.4 ng/mL

## 2020-10-07 LAB — BETA HCG QUANT (REF LAB): hCG Quant: 213 m[IU]/mL

## 2020-10-09 ENCOUNTER — Telehealth: Payer: Self-pay

## 2020-10-09 ENCOUNTER — Other Ambulatory Visit: Payer: Self-pay | Admitting: Women's Health

## 2020-10-09 DIAGNOSIS — O0281 Inappropriate change in quantitative human chorionic gonadotropin (hCG) in early pregnancy: Secondary | ICD-10-CM

## 2020-10-09 NOTE — Telephone Encounter (Signed)
Pt calling about lab results from 10/06/20

## 2020-10-10 ENCOUNTER — Other Ambulatory Visit: Payer: Self-pay | Admitting: Women's Health

## 2020-10-10 DIAGNOSIS — O039 Complete or unspecified spontaneous abortion without complication: Secondary | ICD-10-CM

## 2020-10-10 LAB — BETA HCG QUANT (REF LAB): hCG Quant: 183 m[IU]/mL

## 2020-10-16 ENCOUNTER — Other Ambulatory Visit: Payer: Self-pay | Admitting: Women's Health

## 2020-10-17 LAB — BETA HCG QUANT (REF LAB): hCG Quant: 17 m[IU]/mL

## 2021-08-27 ENCOUNTER — Ambulatory Visit: Payer: 59 | Admitting: Adult Health

## 2021-08-27 ENCOUNTER — Other Ambulatory Visit: Payer: Self-pay

## 2021-08-27 ENCOUNTER — Encounter: Payer: Self-pay | Admitting: Adult Health

## 2021-08-27 VITALS — BP 149/87 | HR 78 | Ht 65.5 in | Wt 249.5 lb

## 2021-08-27 DIAGNOSIS — N611 Abscess of the breast and nipple: Secondary | ICD-10-CM | POA: Insufficient documentation

## 2021-08-27 DIAGNOSIS — N6322 Unspecified lump in the left breast, upper inner quadrant: Secondary | ICD-10-CM | POA: Insufficient documentation

## 2021-08-27 MED ORDER — AMOXICILLIN-POT CLAVULANATE 875-125 MG PO TABS: 1.0000 | ORAL_TABLET | Freq: Two times a day (BID) | ORAL | 0 refills | Status: DC

## 2021-08-27 NOTE — Progress Notes (Signed)
  Subjective:     Patient ID: Susan Reeves, female   DOB: 12/05/1975, 45 y.o.   MRN: [id number]  HPI Everlean is a 45 year old white female,married, G10P1, in complaining of breat mass with tenderness in left breast and boil,right breast, has had in the past. Lab Results  Component Value Date   DIAGPAP  02/02/2020    - Negative for intraepithelial lesion or malignancy (NILM)   HPVHIGH Negative 02/02/2020     Review of Systems Left breast mass Boil right breast Reviewed past medical,surgical, social and family history. Reviewed medications and allergies.     Objective:   Physical Exam BP (!) 149/87 (BP Location: Left Arm, Patient Position: Sitting, Cuff Size: Large)   Pulse 78   Ht 5' 5.5" (1.664 m)   Wt 249 lb 8 oz (113.2 kg)   LMP 08/07/2021   Breastfeeding No   BMI 40.89 kg/m   Skin warm and dry,  Breasts:no dominate palpable mass, retraction or nipple discharge on the right has 2 boils right breat at 6-7 o'clock, one is chronic, and on left, no retraction or nipple discharge but has 1 cm mass at 9 0'clock 4 FB from nipple that is tender. Fall risk is low  Upstream - 08/27/21 0910       Pregnancy Intention Screening   Does the patient want to become pregnant in the next year? Ok Either Way    Does the patient's partner want to become pregnant in the next year? Ok Either Way    Would the patient like to discuss contraceptive options today? No      Contraception Wrap Up   Current Method No Method - Other Reason    End Method No Method - Other Reason                Assessment:     1. Mass of upper inner quadrant of left breast Scheduled diagnostic bilateral  mammogram and US(right and left), for her 09/11/21 at 3 pm at [facility]  2. Boil, breast Rx Augmentin Meds ordered this encounter  Medications   amoxicillin-clavulanate (AUGMENTIN) 875-125 MG tablet    Sig: Take 1 tablet by mouth 2 (two) times daily.    Dispense:  28 tablet    Refill:  0    Order Specific  Question:   Supervising Provider    Answer:   Lazaro Arms [[id number]]       Plan:     Decrease smoking Follow up in 3 weeks

## 2021-09-11 ENCOUNTER — Ambulatory Visit (HOSPITAL_COMMUNITY)
Admission: RE | Admit: 2021-09-11 | Discharge: 2021-09-11 | Disposition: A | Discharge status: Home / Self Care | Payer: 59 | Source: Ambulatory Visit | Attending: Adult Health | Admitting: Adult Health

## 2021-09-11 ENCOUNTER — Other Ambulatory Visit: Payer: Self-pay | Admitting: Adult Health

## 2021-09-11 ENCOUNTER — Other Ambulatory Visit: Payer: Self-pay

## 2021-09-11 ENCOUNTER — Encounter (HOSPITAL_COMMUNITY): Payer: Self-pay

## 2021-09-11 DIAGNOSIS — N6322 Unspecified lump in the left breast, upper inner quadrant: Secondary | ICD-10-CM | POA: Diagnosis not present

## 2021-09-11 DIAGNOSIS — N611 Abscess of the breast and nipple: Secondary | ICD-10-CM | POA: Diagnosis present

## 2021-09-18 ENCOUNTER — Ambulatory Visit: Payer: 59 | Admitting: Adult Health

## 2021-09-18 ENCOUNTER — Encounter: Payer: Self-pay | Admitting: Adult Health

## 2021-09-18 ENCOUNTER — Other Ambulatory Visit: Payer: Self-pay

## 2021-09-18 VITALS — BP 153/78 | HR 72 | Ht 65.5 in | Wt 250.0 lb

## 2021-09-18 DIAGNOSIS — N611 Abscess of the breast and nipple: Secondary | ICD-10-CM

## 2021-09-18 NOTE — Progress Notes (Signed)
  Subjective:     Patient ID: Susan Reeves, female   DOB: 1976-06-16, 45 y.o.   MRN: [id number]  HPI Susan Reeves is a 45 year old white female,married, G1P1, in for recheck boil on right breast, she finished Augmentin.   Review of Systems Still has some drainage right breast boil if squeezes, she says it is chronic Reviewed past medical,surgical, social and family history. Reviewed medications and allergies.     Objective:   Physical Exam BP (!) 153/78 (BP Location: Left Arm, Patient Position: Sitting, Cuff Size: Large)   Pulse 72   Ht 5' 5.5" (1.664 m)   Wt 250 lb (113.4 kg)   LMP 09/06/2021   BMI 40.97 kg/m      Skin warm and dry,  Breasts:no dominate palpable mass, retraction or nipple discharge, has resolving boil right breast at 6 0'clock. Mammogram 09/11/21 showed fat lobule left breast. Fall risk is low  Upstream - 09/18/21 0845       Pregnancy Intention Screening   Does the patient want to become pregnant in the next year? Ok Either Way    Does the patient's partner want to become pregnant in the next year? Ok Either Way    Would the patient like to discuss contraceptive options today? No      Contraception Wrap Up   Current Method No Method - Other Reason    End Method No Method - Other Reason    Contraception Counseling Provided No             Assessment:     1. Boil, breast,resolving   Continue warm compresses Do not squeeze Continue to decrease smoking She declines surgical referral for evaluation  Plan:     Follow up in 4 weeks

## 2021-10-16 ENCOUNTER — Other Ambulatory Visit: Payer: Self-pay

## 2021-10-16 ENCOUNTER — Encounter: Payer: Self-pay | Admitting: Adult Health

## 2021-10-16 ENCOUNTER — Ambulatory Visit (INDEPENDENT_AMBULATORY_CARE_PROVIDER_SITE_OTHER): Payer: 59 | Admitting: Adult Health

## 2021-10-16 VITALS — BP 151/83 | HR 76 | Ht 65.5 in | Wt 247.8 lb

## 2021-10-16 DIAGNOSIS — L732 Hidradenitis suppurativa: Secondary | ICD-10-CM | POA: Diagnosis not present

## 2021-10-16 DIAGNOSIS — N611 Abscess of the breast and nipple: Secondary | ICD-10-CM | POA: Diagnosis not present

## 2021-10-16 DIAGNOSIS — N926 Irregular menstruation, unspecified: Secondary | ICD-10-CM | POA: Diagnosis not present

## 2021-10-16 NOTE — Progress Notes (Signed)
  Subjective:     Patient ID: Susan Reeves, female   DOB: 11-26-1976, 45 y.o.   MRN: [id number]  HPI Susan Reeves is a 45 year old white female, married, in for recheck on breasts and had faint +HPT.  Breast better but has 1 area with milky discharge if squeezed. Lab Results  Component Value Date   DIAGPAP  02/02/2020    - Negative for intraepithelial lesion or malignancy (NILM)   HPVHIGH Negative 02/02/2020     Review of Systems Had faint HPT this am 1 area of milky discharge right breast  Reviewed past medical,surgical, social and family history. Reviewed medications and allergies.     Objective:   Physical Exam BP (!) 151/83 (BP Location: Right Arm, Patient Position: Sitting, Cuff Size: Large)   Pulse 76   Ht 5' 5.5" (1.664 m)   Wt 247 lb 12.8 oz (112.4 kg)   LMP 09/20/2021 (Approximate) Comment: PMP 09/06/21  BMI 40.61 kg/m      Skin warm and dry,  Breasts:no dominate palpable mass, retraction or nipple discharge, area of prior boil right breast at 6-7 o'clock has scarring, and if squeezed y pt, still milky discharge at 1 site.    Assessment:     1. Irregular periods Will check QHCG and progesterone level today Will talk when labs back  2. Boil, breast,resolved  3. Hidradenitis     Plan:     Follow up prn

## 2021-10-17 LAB — PROGESTERONE: Progesterone: 3.2 ng/mL

## 2021-10-17 LAB — BETA HCG QUANT (REF LAB): hCG Quant: <1 m[IU]/mL

## 2022-03-05 ENCOUNTER — Ambulatory Visit: Payer: 59 | Admitting: Family Medicine

## 2022-03-05 VITALS — BP 158/88 | HR 88 | Temp 98.1°F | Ht 65.5 in | Wt 250.0 lb

## 2022-03-05 DIAGNOSIS — E890 Postprocedural hypothyroidism: Secondary | ICD-10-CM | POA: Diagnosis not present

## 2022-03-05 DIAGNOSIS — Z13220 Encounter for screening for lipoid disorders: Secondary | ICD-10-CM | POA: Diagnosis not present

## 2022-03-05 DIAGNOSIS — R0789 Other chest pain: Secondary | ICD-10-CM | POA: Insufficient documentation

## 2022-03-05 DIAGNOSIS — Z8632 Personal history of gestational diabetes: Secondary | ICD-10-CM | POA: Insufficient documentation

## 2022-03-05 DIAGNOSIS — R079 Chest pain, unspecified: Secondary | ICD-10-CM | POA: Diagnosis not present

## 2022-03-05 DIAGNOSIS — I10 Essential (primary) hypertension: Secondary | ICD-10-CM

## 2022-03-05 MED ORDER — AMLODIPINE BESYLATE 5 MG PO TABS: 5.0000 mg | ORAL_TABLET | Freq: Every day | ORAL | 3 refills | Status: DC

## 2022-03-05 NOTE — Assessment & Plan Note (Signed)
Patient with atypical chest pain and multiple other complaints today. ?EKG was done today.  Independent interpretation: Normal sinus rhythm with a rate of 86.  Normal axis.  Normal intervals.  No ST or T wave changes.  Unremarkable EKG. ?Labs for further evaluation today. ?

## 2022-03-05 NOTE — Assessment & Plan Note (Signed)
Uncontrolled.  Starting on amlodipine. ?

## 2022-03-05 NOTE — Progress Notes (Signed)
? ?Subjective:  ?Patient ID: Susan Reeves, female    DOB: 1976-08-13  Age: 46 y.o. MRN: [id number] ? ?CC: Establish care, multiple concerns/complaints ? ?HPI ?Susan Reeves is a 46 y.o. female presents to the clinic today to establish care.  She has multiple complaints and concerns at this time. ? ?Patient has a history of gestational diabetes.  Also reports a family history of diabetes.  She states that she has not been feeling well recently.  She reports dizzy spells, "fogginess".  Does also had some central chest pain for the past 2 days.  Shortness of breath particular with activity.  She is a current smoker.  Patient also states that she recently had some swelling in her feet that took quite some time to resolve.  Patient states that she is concerned that she may be diabetic.  Patient also notes that she has recently had elevated blood pressures as high as 300 systolic at home.  BP is elevated here.  ? ?PMH, Surgical Hx, Family Hx, Social History reviewed and updated as below. ? ?Past Medical History:  ?Diagnosis Date  ? AMA (advanced maternal age) multigravida 35+ 12/06/2015  ? Gestational diabetes   ? History of HPV infection   ? 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  ? Pregnant 12/06/2015  ? Smoker 12/06/2015  ? Thyroid disease   ? has half a thyroid  ? ?Past Surgical History:  ?Procedure Laterality Date  ? THYROID SURGERY    ? ?Family History  ?Problem Relation Age of Onset  ? Diabetes Mother   ? Stroke Mother   ? Thyroid disease Mother   ? Diabetes Father   ? Hyperlipidemia Father   ? Diabetes Maternal Grandmother   ? Cancer Maternal Grandmother   ?     stomach  ? Diabetes Paternal Grandmother   ? Heart attack Paternal Grandfather   ? ?Social History  ? ?Tobacco Use  ? Smoking status: Every Day  ?  Packs/day: 1.00  ?  Years: 26.00  ?  Pack years: 26.00  ?  Types: Cigarettes  ? Smokeless tobacco: Never  ?Substance Use Topics  ? Alcohol use: No  ?  Comment: not now  ? ? ?Review of Systems ?Per HPI ? ?Objective:   ? ?Today's Vitals: BP (!) 158/88   Pulse 88   Temp 98.1 ?F (36.7 ?C)   Ht 5' 5.5" (1.664 m)   Wt 250 lb (113.4 kg)   SpO2 96%   BMI 40.97 kg/m?  ? ?Physical Exam ?Vitals and nursing note reviewed.  ?Constitutional:   ?   Appearance: Normal appearance. She is obese.  ?HENT:  ?   Head: Normocephalic and atraumatic.  ?Eyes:  ?   General:     ?   Right eye: No discharge.     ?   Left eye: No discharge.  ?   Conjunctiva/sclera: Conjunctivae normal.  ?Cardiovascular:  ?   Rate and Rhythm: Normal rate and regular rhythm.  ?   Heart sounds: No murmur heard. ?Pulmonary:  ?   Effort: Pulmonary effort is normal.  ?   Breath sounds: Normal breath sounds. No wheezing, rhonchi or rales.  ?Neurological:  ?   Mental Status: She is alert.  ?Psychiatric:     ?   Mood and Affect: Mood normal.     ?   Behavior: Behavior normal.  ? ? ? ?Assessment & Plan:  ? ?Problem List Items Addressed This Visit   ? ?  ? Cardiovascular and Mediastinum  ?  Hypertension  ?  Uncontrolled.  Starting on amlodipine. ?  ?  ? Relevant Medications  ? amLODipine (NORVASC) 5 MG tablet  ?  ? Other  ? Atypical chest pain  ?  Patient with atypical chest pain and multiple other complaints today. ?EKG was done today.  Independent interpretation: Normal sinus rhythm with a rate of 86.  Normal axis.  Normal intervals.  No ST or T wave changes.  Unremarkable EKG. ?Labs for further evaluation today. ?  ?  ? ?Other Visit Diagnoses   ? ? Chest pain, unspecified type    -  Primary  ? Relevant Orders  ? EKG 12-Lead (Completed)  ? CBC  ? CMP14+EGFR  ? History of gestational diabetes      ? Relevant Orders  ? Hemoglobin A1c  ? Screening, lipid      ? Relevant Orders  ? Lipid panel  ? History of partial thyroidectomy      ? Relevant Orders  ? TSH  ? ?  ? ? ?Meds ordered this encounter  ?Medications  ? amLODipine (NORVASC) 5 MG tablet  ?  Sig: Take 1 tablet (5 mg total) by mouth daily.  ?  Dispense:  90 tablet  ?  Refill:  3  ? ? ? ?Follow-up: Return in about 1 month  (around 04/04/2022). ? ?Thersa Salt DO ?[place] ? ? ? ? ?

## 2022-03-05 NOTE — Patient Instructions (Signed)
Labs today. ? ?EKG looked good. ? ?Medication as prescribed. ? ?Follow up in 1 month. ?

## 2022-03-06 ENCOUNTER — Other Ambulatory Visit: Payer: Self-pay | Admitting: Family Medicine

## 2022-03-06 LAB — CMP14+EGFR
ALT: 16 IU/L (ref 0–32)
AST: 22 IU/L (ref 0–40)
Albumin/Globulin Ratio: 1.6 (ref 1.2–2.2)
Albumin: 4.4 g/dL (ref 3.8–4.8)
Alkaline Phosphatase: 102 IU/L (ref 44–121)
BUN/Creatinine Ratio: 17 (ref 9–23)
BUN: 11 mg/dL (ref 6–24)
Bilirubin Total: 0.5 mg/dL (ref 0.0–1.2)
CO2: 25 mmol/L (ref 20–29)
Calcium: 9.4 mg/dL (ref 8.7–10.2)
Chloride: 101 mmol/L (ref 96–106)
Creatinine, Ser: 0.65 mg/dL (ref 0.57–1.00)
Globulin, Total: 2.8 g/dL (ref 1.5–4.5)
Glucose: 135 mg/dL — ABNORMAL HIGH (ref 70–99)
Potassium: 4.3 mmol/L (ref 3.5–5.2)
Sodium: 134 mmol/L (ref 134–144)
Total Protein: 7.2 g/dL (ref 6.0–8.5)
eGFR: 110 mL/min/{1.73_m2} (ref >59)

## 2022-03-06 LAB — CBC
Hematocrit: 42.7 % (ref 34.0–46.6)
Hemoglobin: 14.5 g/dL (ref 11.1–15.9)
MCH: 30.3 pg (ref 26.6–33.0)
MCHC: 34 g/dL (ref 31.5–35.7)
MCV: 89 fL (ref 79–97)
Platelets: 260 10*3/uL (ref 150–450)
RBC: 4.79 x10E6/uL (ref 3.77–5.28)
RDW: 13.8 % (ref 11.7–15.4)
WBC: 7.3 10*3/uL (ref 3.4–10.8)

## 2022-03-06 LAB — TSH: TSH: 1.35 u[IU]/mL (ref 0.450–4.500)

## 2022-03-06 LAB — LIPID PANEL
Chol/HDL Ratio: 7 ratio — ABNORMAL HIGH (ref 0.0–4.4)
Cholesterol, Total: 287 mg/dL — ABNORMAL HIGH (ref 100–199)
HDL: 41 mg/dL (ref >39)
LDL Chol Calc (NIH): 217 mg/dL — ABNORMAL HIGH (ref 0–99)
Triglycerides: 154 mg/dL — ABNORMAL HIGH (ref 0–149)
VLDL Cholesterol Cal: 29 mg/dL (ref 5–40)

## 2022-03-06 LAB — HEMOGLOBIN A1C
Est. average glucose Bld gHb Est-mCnc: 166 mg/dL
Hgb A1c MFr Bld: 7.4 % — ABNORMAL HIGH (ref 4.8–5.6)

## 2022-03-06 MED ORDER — ROSUVASTATIN CALCIUM 20 MG PO TABS: 20.0000 mg | ORAL_TABLET | Freq: Every day | ORAL | 3 refills | Status: DC

## 2022-03-06 MED ORDER — METFORMIN HCL 500 MG PO TABS: 500.0000 mg | ORAL_TABLET | Freq: Two times a day (BID) | ORAL | 3 refills | Status: DC

## 2022-04-04 ENCOUNTER — Ambulatory Visit: Payer: 59 | Admitting: Family Medicine

## 2022-04-04 VITALS — BP 132/78 | HR 71 | Temp 98.9°F | Ht 65.5 in | Wt 242.0 lb

## 2022-04-04 DIAGNOSIS — E782 Mixed hyperlipidemia: Secondary | ICD-10-CM

## 2022-04-04 DIAGNOSIS — I10 Essential (primary) hypertension: Secondary | ICD-10-CM | POA: Diagnosis not present

## 2022-04-04 DIAGNOSIS — G43909 Migraine, unspecified, not intractable, without status migrainosus: Secondary | ICD-10-CM | POA: Diagnosis not present

## 2022-04-04 DIAGNOSIS — E119 Type 2 diabetes mellitus without complications: Secondary | ICD-10-CM

## 2022-04-04 MED ORDER — SUMATRIPTAN SUCCINATE 50 MG PO TABS: 50.0000 mg | ORAL_TABLET | Freq: Once | ORAL | 3 refills | Status: DC

## 2022-04-04 NOTE — Progress Notes (Signed)
? ?Subjective:  ?Patient ID: Susan Reeves, female    DOB: 02-Jul-1976  Age: 46 y.o. MRN: [id number] ? ?CC: ?Chief Complaint  ?Patient presents with  ? Follow-up  ? optical migraine  ?  Tylenol not working  ? ? ?HPI: ? ?46 year old female with recent diagnosis of type 2 diabetes, hypertension, hyperlipidemia presents for follow-up. ? ?Patient tolerating Metformin and seems to be doing well.  ? ?Also tolerating statin without any troubles.  ? ?Having frequent migraines. Having been occurring several times a week. Using Advil without improvement. Would like to discuss treatment options today.  ? ?HTN stable on Norvasc. ? ?Patient Active Problem List  ? Diagnosis Date Noted  ? Migraine headache 04/07/2022  ? Type 2 diabetes mellitus without complications (HCC) 04/04/2022  ? Mixed hyperlipidemia 04/04/2022  ? Hidradenitis 10/16/2021  ? Hypertension 12/18/2018  ? 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  ? Smoker 12/06/2015  ? ? ?Social Hx   ?Social History  ? ?Socioeconomic History  ? Marital status: Married  ?  Spouse name: Not on file  ? Number of children: Not on file  ? Years of education: Not on file  ? Highest education level: Not on file  ?Occupational History  ? Not on file  ?Tobacco Use  ? Smoking status: Every Day  ?  Packs/day: 1.00  ?  Years: 26.00  ?  Pack years: 26.00  ?  Types: Cigarettes  ? Smokeless tobacco: Never  ?Vaping Use  ? Vaping Use: Never used  ?Substance and Sexual Activity  ? Alcohol use: No  ?  Comment: not now  ? Drug use: No  ? Sexual activity: Yes  ?  Birth control/protection: None  ?Other Topics Concern  ? Not on file  ?Social History Narrative  ? Not on file  ? ?Social Determinants of Health  ? ?Financial Resource Strain: Not on file  ?Food Insecurity: Not on file  ?Transportation Needs: Not on file  ?Physical Activity: Not on file  ?Stress: Not on file  ?Social Connections: Not on file  ? ? ?Review of Systems  ?Constitutional: Negative.   ?Neurological:  Positive for headaches.   ? ? ?Objective:  ?BP 132/78   Pulse 71   Temp 98.9 ?F (37.2 ?C) (Oral)   Ht 5' 5.5" (1.664 m)   Wt 242 lb (109.8 kg)   SpO2 98%   BMI 39.66 kg/m?  ? ? ?  04/04/2022  ?  9:40 AM 03/05/2022  ? 10:10 AM 10/16/2021  ?  8:51 AM  ?BP/Weight  ?Systolic BP 132 158 151  ?Diastolic BP 78 88 83  ?Wt. (Lbs) 242 250 247.8  ?BMI 39.66 kg/m2 40.97 kg/m2 40.61 kg/m2  ? ? ?Physical Exam ?Constitutional:   ?   General: She is not in acute distress. ?   Appearance: Normal appearance. She is obese.  ?HENT:  ?   Head: Normocephalic and atraumatic.  ?Cardiovascular:  ?   Rate and Rhythm: Normal rate and regular rhythm.  ?Pulmonary:  ?   Effort: Pulmonary effort is normal.  ?   Breath sounds: Normal breath sounds. No wheezing, rhonchi or rales.  ?Neurological:  ?   Mental Status: She is alert.  ?Psychiatric:     ?   Mood and Affect: Mood normal.     ?   Behavior: Behavior normal.  ? ? ?Lab Results  ?Component Value Date  ? WBC 7.3 03/05/2022  ? HGB 14.5 03/05/2022  ? HCT 42.7 03/05/2022  ? PLT 260 03/05/2022  ?  GLUCOSE 135 (H) 03/05/2022  ? CHOL 287 (H) 03/05/2022  ? TRIG 154 (H) 03/05/2022  ? HDL 41 03/05/2022  ? LDLCALC 217 (H) 03/05/2022  ? ALT 16 03/05/2022  ? AST 22 03/05/2022  ? NA 134 03/05/2022  ? K 4.3 03/05/2022  ? CL 101 03/05/2022  ? CREATININE 0.65 03/05/2022  ? BUN 11 03/05/2022  ? CO2 25 03/05/2022  ? TSH 1.350 03/05/2022  ? HGBA1C 7.4 (H) 03/05/2022  ? ? ? ?Assessment & Plan:  ? ?Problem List Items Addressed This Visit   ? ?  ? Cardiovascular and Mediastinum  ? Hypertension  ?  Stable. Continue Norvasc. ? ?  ?  ? Migraine headache  ?  Treating with Imitrex. ? ?  ?  ?  ? Endocrine  ? Type 2 diabetes mellitus without complications (HCC) - Primary  ?  Continue Metformin. Labs before next visit. ? ?  ?  ? Relevant Orders  ? Hemoglobin A1c  ? Microalbumin / creatinine urine ratio  ?  ? Other  ? Mixed hyperlipidemia  ?  Stable. Labs before next visit. ?Tolerating statin. Continue. ? ?  ?  ? Relevant Orders  ? Lipid panel   ? ? ?Meds ordered this encounter  ?Medications  ? SUMAtriptan (IMITREX) 50 MG tablet  ?  Sig: Take 1-2 tablets (50-100 mg total) by mouth once for 1 dose. At the onset of migraine. May repeat dose in 2 hours if headache persists or recurs.  ?  Dispense:  20 tablet  ?  Refill:  3  ? ? ?Follow-up:  In July ? ?Everlene Other DO ?[facility] ? ?

## 2022-04-04 NOTE — Patient Instructions (Signed)
Follow-up in July (after the 4th). ? ?Labs before your next visit (after July 4th). ? ?Take care ? ?Dr. Adriana Simas  ?

## 2022-04-07 DIAGNOSIS — G43909 Migraine, unspecified, not intractable, without status migrainosus: Secondary | ICD-10-CM | POA: Insufficient documentation

## 2022-04-07 NOTE — Assessment & Plan Note (Signed)
Treating with Imitrex. ?

## 2022-04-07 NOTE — Assessment & Plan Note (Signed)
Stable. Labs before next visit. ?Tolerating statin. Continue. ?

## 2022-04-07 NOTE — Assessment & Plan Note (Signed)
Continue Metformin. Labs before next visit. ?

## 2022-04-07 NOTE — Assessment & Plan Note (Signed)
Stable. Continue Norvasc.  

## 2022-06-09 IMAGING — MG DIGITAL DIAGNOSTIC BILAT W/ TOMO W/ CAD
6 of 10 series · 6 of 30 positions shown · non-contrast
Comparison: None.

CLINICAL DATA: 45-year-old female with a palpable area of concern
in the left breast.

EXAM:
DIGITAL DIAGNOSTIC BILATERAL MAMMOGRAM WITH TOMOSYNTHESIS AND CAD;
ULTRASOUND LEFT BREAST LIMITED
TECHNIQUE: Bilateral digital diagnostic mammography and breast tomosynthesis
was performed. The images were evaluated with computer-aided
detection.; Targeted ultrasound examination of the left breast was
performed.

[L CC synth-2D (1 of 2)]
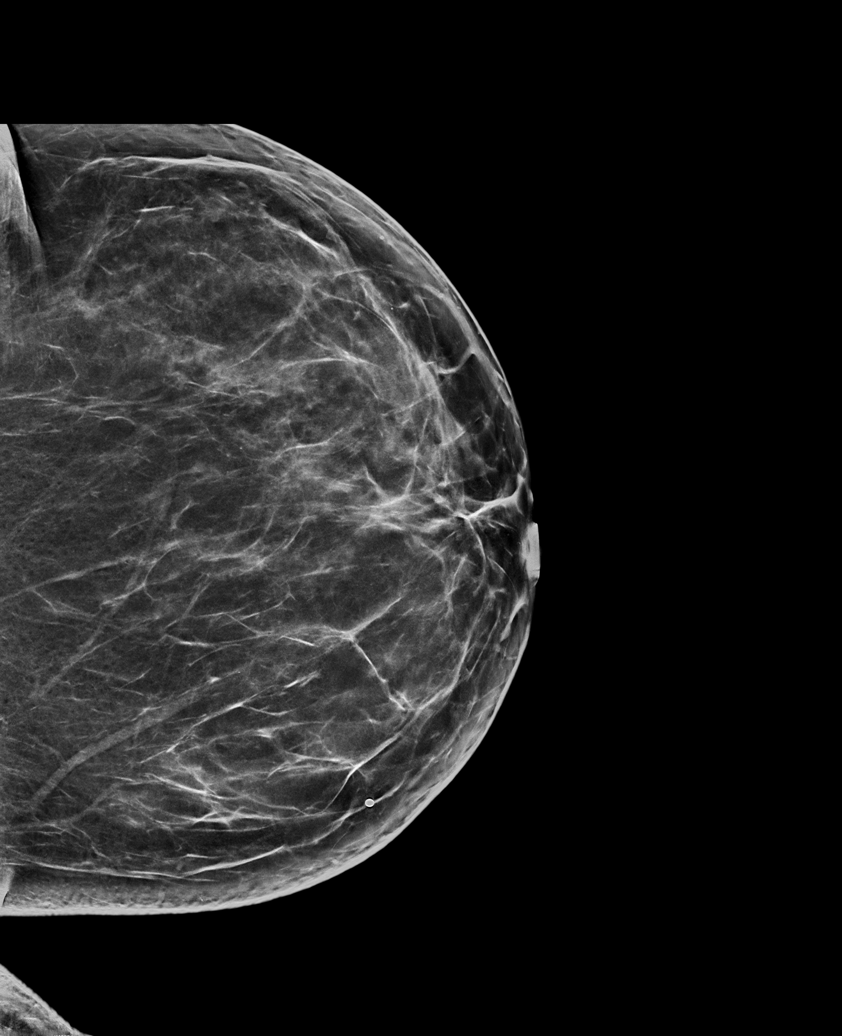

[R MLO synth-2D]
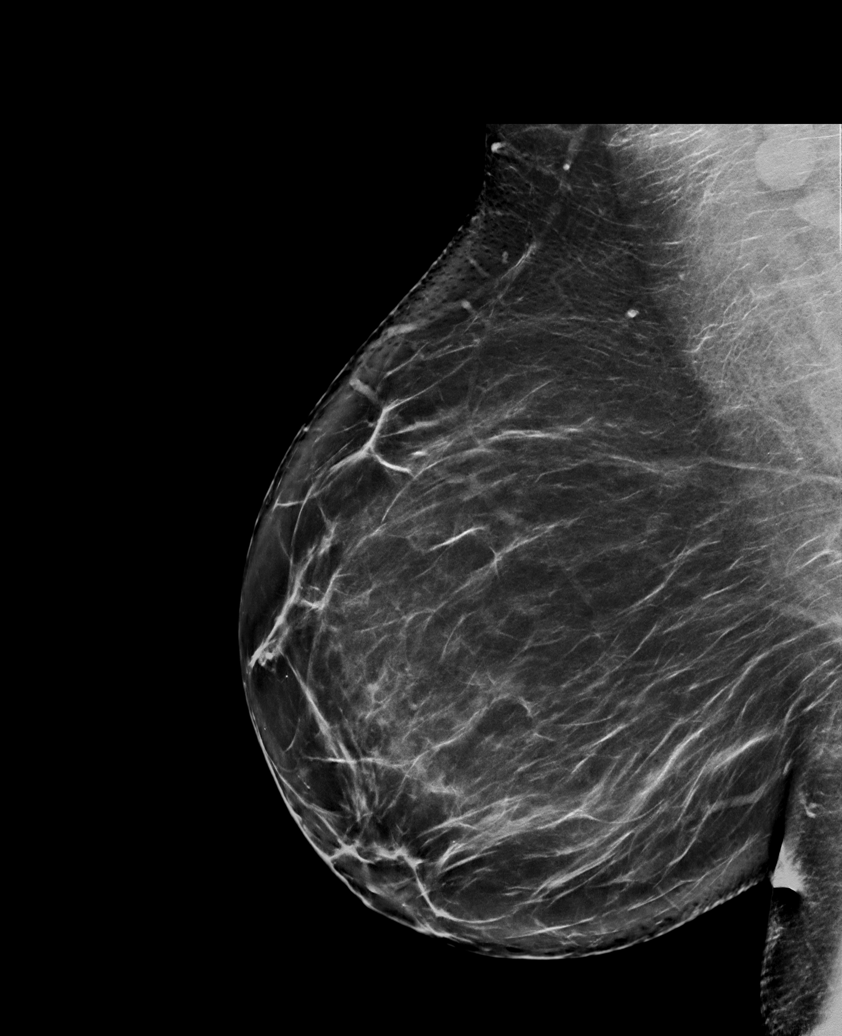

[L CC synth-2D (2 of 2)]
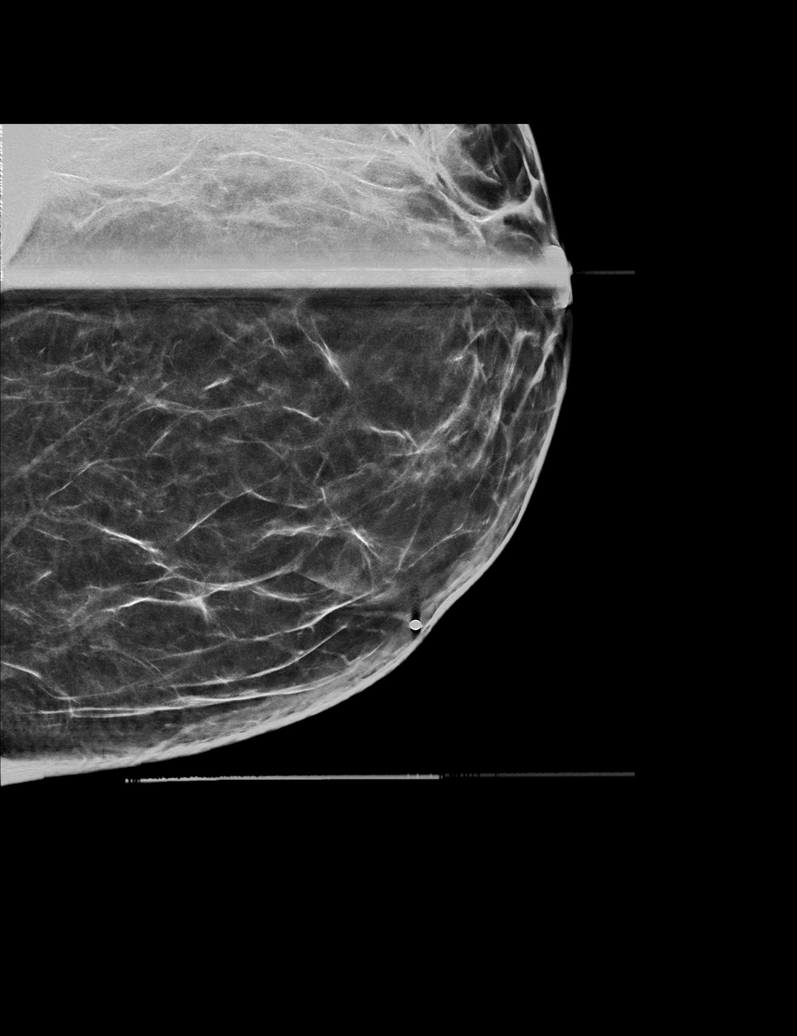

[R CC synth-2D]
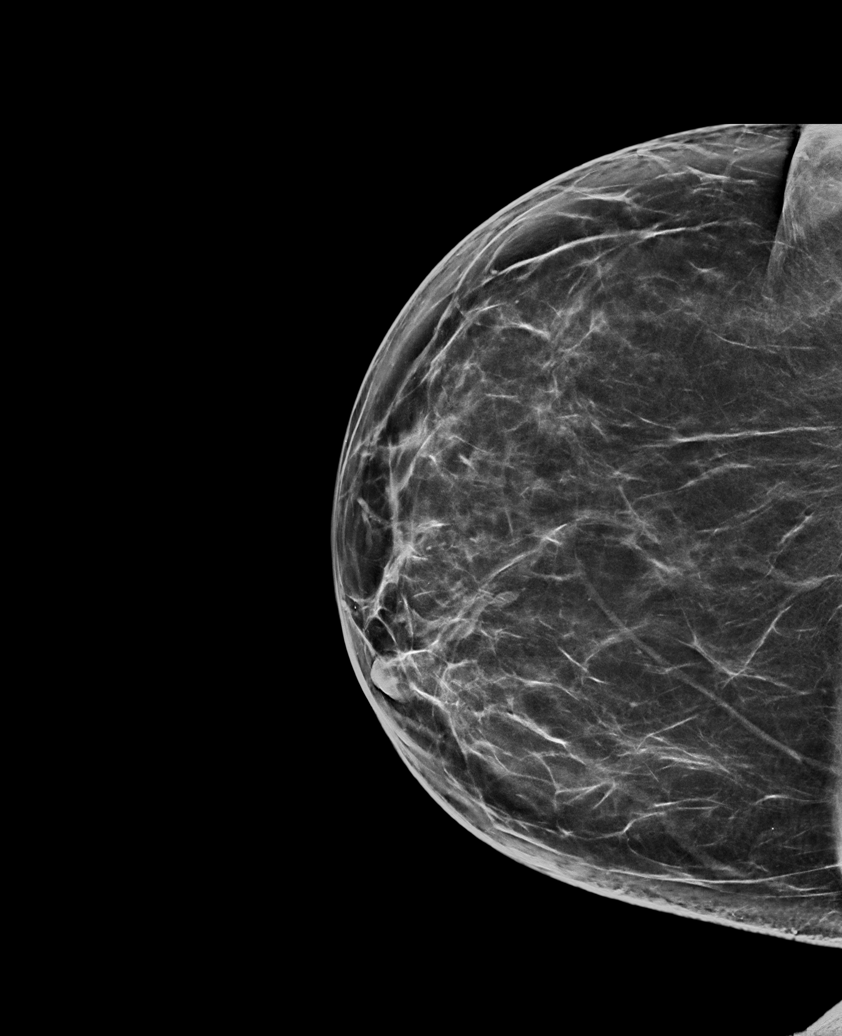

[L MLO synth-2D]
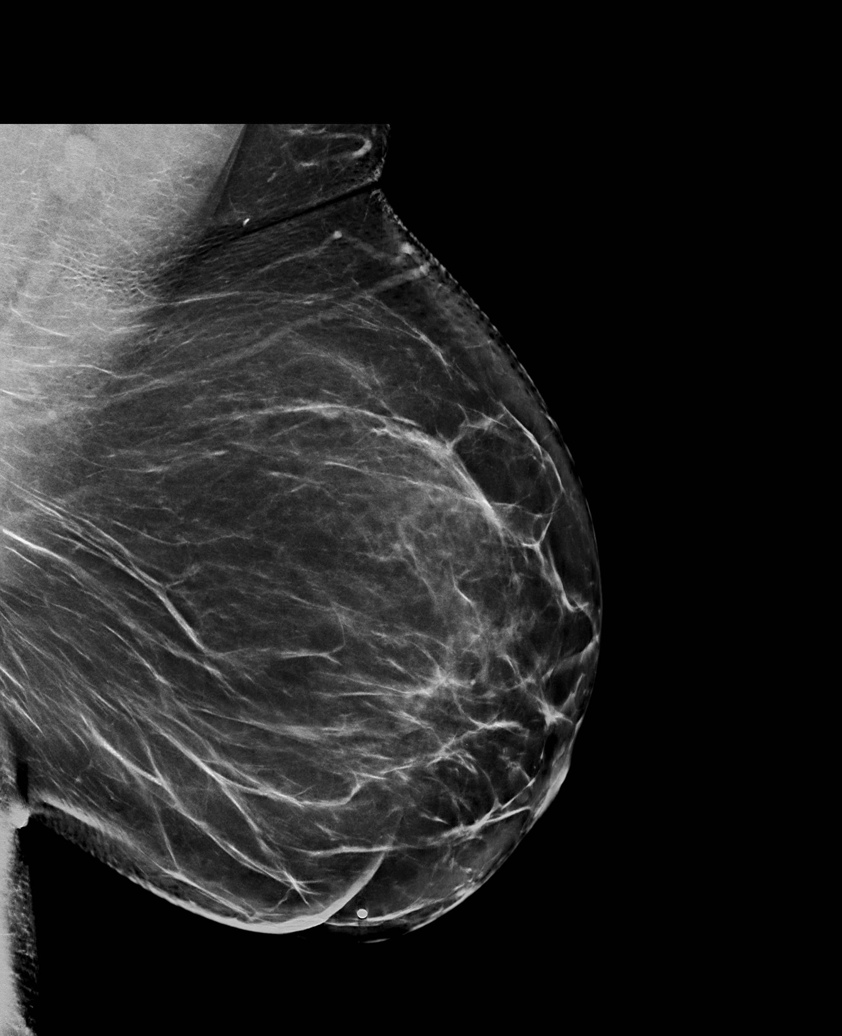

[L CC tomo · tomo slice 43/84.0]
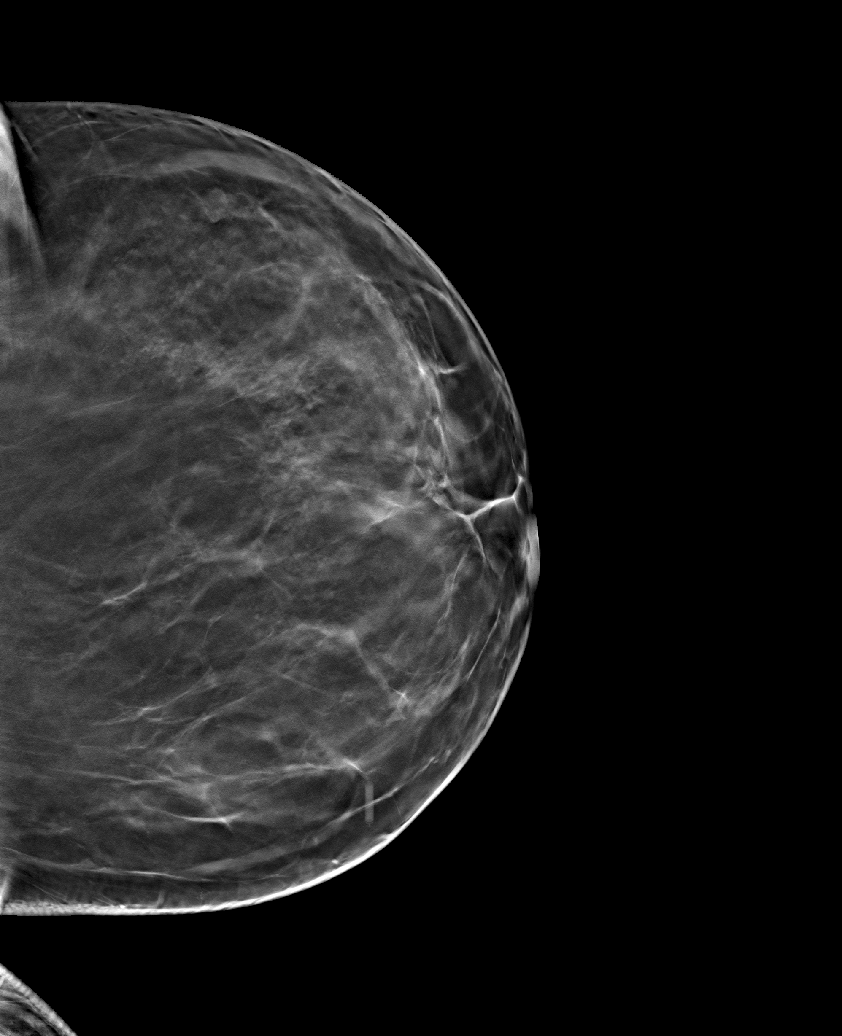

[6 of 30 positions shown; findings below may reference images not displayed]

ACR Breast Density Category b: There are scattered areas of
fibroglandular density.
FINDINGS: No suspicious masses or calcifications seen in either breast. Spot
compression CC tomograms were performed over the palpable area of
concern in the lower inner left breast with no definite abnormality
seen.

Physical examination at site of palpable concern in the inner left
breast reveals soft fullness at the approximate 9 o'clock position.

Targeted ultrasound of left breast was performed. No suspicious
masses or abnormality seen, only fibrofatty tissue identified in
this location. A prominent fat lobule may account for the palpable
area of concern.
IMPRESSION: 1. No mammographic or sonographic abnormalities at site of palpable
concern in the lower inner left breast. This palpable area of
concern may be related to a prominent fat lobule.

2.  No mammographic evidence of malignancy in either breast.

RECOMMENDATION:
Screening mammogram in one year.(Code:43-R-IXK)

I have discussed the findings and recommendations with the patient.
If applicable, a reminder letter will be sent to the patient
regarding the next appointment.

BI-RADS CATEGORY  1: Negative.

## 2022-06-15 LAB — LIPID PANEL
Chol/HDL Ratio: 3.6 ratio (ref 0.0–4.4)
Cholesterol, Total: 150 mg/dL (ref 100–199)
HDL: 42 mg/dL (ref >39)
LDL Chol Calc (NIH): 91 mg/dL (ref 0–99)
Triglycerides: 87 mg/dL (ref 0–149)
VLDL Cholesterol Cal: 17 mg/dL (ref 5–40)

## 2022-06-15 LAB — HEMOGLOBIN A1C
Est. average glucose Bld gHb Est-mCnc: 143 mg/dL
Hgb A1c MFr Bld: 6.6 % — ABNORMAL HIGH (ref 4.8–5.6)

## 2022-06-15 LAB — MICROALBUMIN / CREATININE URINE RATIO
Creatinine, Urine: 131.3 mg/dL
Microalb/Creat Ratio: 4 mg/g creat (ref 0–29)
Microalbumin, Urine: 4.8 ug/mL

## 2022-06-17 ENCOUNTER — Encounter: Payer: Self-pay | Admitting: Family Medicine

## 2022-06-17 ENCOUNTER — Ambulatory Visit (INDEPENDENT_AMBULATORY_CARE_PROVIDER_SITE_OTHER): Payer: 59 | Admitting: Family Medicine

## 2022-06-17 VITALS — BP 134/76 | HR 79 | Temp 98.1°F | Wt 240.4 lb

## 2022-06-17 DIAGNOSIS — E119 Type 2 diabetes mellitus without complications: Secondary | ICD-10-CM

## 2022-06-17 DIAGNOSIS — F17200 Nicotine dependence, unspecified, uncomplicated: Secondary | ICD-10-CM

## 2022-06-17 DIAGNOSIS — I10 Essential (primary) hypertension: Secondary | ICD-10-CM | POA: Diagnosis not present

## 2022-06-17 DIAGNOSIS — E782 Mixed hyperlipidemia: Secondary | ICD-10-CM | POA: Diagnosis not present

## 2022-06-17 MED ORDER — ACCU-CHEK SOFTCLIX LANCETS MISC: 12 refills | Status: DC

## 2022-06-17 MED ORDER — BLOOD GLUCOSE TEST STRIPS 333 VI STRP: ORAL_STRIP | 12 refills | Status: DC

## 2022-06-17 MED ORDER — BLOOD GLUCOSE MONITOR KIT: PACK | 0 refills | Status: DC

## 2022-06-17 NOTE — Assessment & Plan Note (Signed)
Advised to quit smoking °

## 2022-06-17 NOTE — Progress Notes (Signed)
Subjective:  Patient ID: Susan Reeves, female    DOB: 11/14/1976  Age: 47 y.o. MRN: [id number]  CC: Chief Complaint  Patient presents with   Diabetes    Pt requesting BGM to check sugars. Pt states it keeps her on track.     HPI:  46 year old female with migraine, HTN, DM-2, tobacco abuse, hyperlipidemia presents for follow up.   Hypertension is stable on amlodipine.  She reports that she occasionally has some lower extremity edema.  Type 2 diabetes is improved.  Most recent A1c down to 6.6 from 7.4.  She is tolerating metformin.  She would like a glucometer.  She has had a dramatic improvement in her lipids as well with the addition of Crestor.  LDL went from 217 to 91.  She continues to smoke.  Not interested in cessation at this time.  Patient Active Problem List   Diagnosis Date Noted   Migraine headache 04/07/2022   Type 2 diabetes mellitus without complications ([place]) 78/67/6720   Mixed hyperlipidemia 04/04/2022   Hidradenitis 10/16/2021   Hypertension 12/18/2018   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   Smoker 12/06/2015    Social Hx   Social History   Socioeconomic History   Marital status: Married    Spouse name: Not on file   Number of children: Not on file   Years of education: Not on file   Highest education level: Not on file  Occupational History   Not on file  Tobacco Use   Smoking status: Every Day    Packs/day: 1.00    Years: 26.00    Total pack years: 26.00    Types: Cigarettes   Smokeless tobacco: Never  Vaping Use   Vaping Use: Never used  Substance and Sexual Activity   Alcohol use: No    Comment: not now   Drug use: No   Sexual activity: Yes    Birth control/protection: None  Other Topics Concern   Not on file  Social History Narrative   Not on file   Social Determinants of Health   Financial Resource Strain: Not on file  Food Insecurity: Not on file  Transportation Needs: Not on file  Physical Activity: Not on file   Stress: Not on file  Social Connections: Not on file    Review of Systems  Constitutional: Negative.   Cardiovascular:  Positive for leg swelling.   Objective:  BP 134/76   Pulse 79   Temp 98.1 F (36.7 C)   Wt 240 lb 6.4 oz (109 kg)   SpO2 97%   BMI 39.40 kg/m      06/17/2022    9:15 AM 04/04/2022    9:40 AM 03/05/2022   10:10 AM  BP/Weight  Systolic BP 947 096 283  Diastolic BP 76 78 88  Wt. (Lbs) 240.4 242 250  BMI 39.4 kg/m2 39.66 kg/m2 40.97 kg/m2    Physical Exam Vitals and nursing note reviewed.  Constitutional:      Appearance: Normal appearance. She is obese.  HENT:     Head: Normocephalic and atraumatic.  Cardiovascular:     Rate and Rhythm: Normal rate and regular rhythm.  Pulmonary:     Effort: Pulmonary effort is normal.     Breath sounds: Normal breath sounds. No wheezing, rhonchi or rales.  Neurological:     Mental Status: She is alert.  Psychiatric:        Mood and Affect: Mood normal.  Behavior: Behavior normal.    Lab Results  Component Value Date   WBC 7.3 03/05/2022   HGB 14.5 03/05/2022   HCT 42.7 03/05/2022   PLT 260 03/05/2022   GLUCOSE 135 (H) 03/05/2022   CHOL 150 06/14/2022   TRIG 87 06/14/2022   HDL 42 06/14/2022   LDLCALC 91 06/14/2022   ALT 16 03/05/2022   AST 22 03/05/2022   NA 134 03/05/2022   K 4.3 03/05/2022   CL 101 03/05/2022   CREATININE 0.65 03/05/2022   BUN 11 03/05/2022   CO2 25 03/05/2022   TSH 1.350 03/05/2022   HGBA1C 6.6 (H) 06/14/2022     Assessment & Plan:   Problem List Items Addressed This Visit       Cardiovascular and Mediastinum   Hypertension    Blood pressure is stable.  Continue amlodipine.        Endocrine   Type 2 diabetes mellitus without complications ([place]) - Primary    Improved following the addition of metformin.  Continue.        Other   Smoker    Advised to quit smoking.      Mixed hyperlipidemia    Dramatic improvement with Crestor.  Continue.       Meds  ordered this encounter  Medications   blood glucose meter kit and supplies KIT    Sig: Dispense based on patient and insurance preference. Use up to four times daily as directed.    Dispense:  1 each    Refill:  0    Order Specific Question:   Number of strips    Answer:   100    Order Specific Question:   Number of lancets    Answer:   100   Accu-Chek Softclix Lancets lancets    Sig: Use to check blood sugars 4 times daily. DX:E11.9    Dispense:  100 each    Refill:  12   Glucose Blood (BLOOD GLUCOSE TEST STRIPS 333) STRP    Sig: Use to check blood sugars 4 times daily. Dx.E11.9    Dispense:  100 strip    Refill:  12    Pt has Accu Check    Follow-up:  Return in about 6 months (around 12/18/2022) for Diabetes follow up, HTN follow up.  [place]

## 2022-06-17 NOTE — Assessment & Plan Note (Signed)
Improved following the addition of metformin.  Continue.

## 2022-06-17 NOTE — Assessment & Plan Note (Signed)
Dramatic improvement with Crestor.  Continue.

## 2022-06-17 NOTE — Patient Instructions (Signed)
Continue your medications.  Follow up in 6 months.  Work on smoking cessation.  Take care  Dr. Adriana Simas

## 2022-06-17 NOTE — Assessment & Plan Note (Signed)
Blood pressure is stable Continue amlodipine 

## 2022-09-02 ENCOUNTER — Encounter: Payer: Self-pay | Admitting: Family Medicine

## 2022-12-02 ENCOUNTER — Other Ambulatory Visit: Payer: Self-pay | Admitting: Family Medicine

## 2022-12-17 ENCOUNTER — Other Ambulatory Visit: Payer: Self-pay | Admitting: Family Medicine

## 2022-12-17 DIAGNOSIS — E119 Type 2 diabetes mellitus without complications: Secondary | ICD-10-CM

## 2022-12-17 DIAGNOSIS — E782 Mixed hyperlipidemia: Secondary | ICD-10-CM

## 2022-12-18 ENCOUNTER — Ambulatory Visit: Payer: 59 | Admitting: Family Medicine

## 2022-12-18 DIAGNOSIS — I10 Essential (primary) hypertension: Secondary | ICD-10-CM

## 2022-12-18 DIAGNOSIS — M79671 Pain in right foot: Secondary | ICD-10-CM

## 2022-12-18 DIAGNOSIS — E782 Mixed hyperlipidemia: Secondary | ICD-10-CM

## 2022-12-18 DIAGNOSIS — E119 Type 2 diabetes mellitus without complications: Secondary | ICD-10-CM

## 2022-12-18 DIAGNOSIS — M79672 Pain in left foot: Secondary | ICD-10-CM

## 2022-12-18 DIAGNOSIS — M7730 Calcaneal spur, unspecified foot: Secondary | ICD-10-CM | POA: Diagnosis not present

## 2022-12-18 LAB — LIPID PANEL
Chol/HDL Ratio: 3.8 ratio (ref 0.0–4.4)
Cholesterol, Total: 176 mg/dL (ref 100–199)
HDL: 46 mg/dL (ref >39)
LDL Chol Calc (NIH): 102 mg/dL — ABNORMAL HIGH (ref 0–99)
Triglycerides: 160 mg/dL — ABNORMAL HIGH (ref 0–149)
VLDL Cholesterol Cal: 28 mg/dL (ref 5–40)

## 2022-12-18 LAB — HEMOGLOBIN A1C
Est. average glucose Bld gHb Est-mCnc: 151 mg/dL
Hgb A1c MFr Bld: 6.9 % — ABNORMAL HIGH (ref 4.8–5.6)

## 2022-12-18 NOTE — Patient Instructions (Addendum)
Continue your medication.  Watch your diet.  I have placed referral to podiatry.  Follow up in 6 months.

## 2022-12-19 DIAGNOSIS — M79671 Pain in right foot: Secondary | ICD-10-CM | POA: Insufficient documentation

## 2022-12-19 NOTE — Assessment & Plan Note (Signed)
Referring to podiatry

## 2022-12-19 NOTE — Assessment & Plan Note (Signed)
A1c at goal.  Continue metformin.

## 2022-12-19 NOTE — Assessment & Plan Note (Signed)
Likely has familial hyperlipidemia.  Has had dramatic response to Crestor.  Continue.

## 2022-12-19 NOTE — Assessment & Plan Note (Signed)
Stable. °-Continue amlodipine °

## 2022-12-19 NOTE — Progress Notes (Signed)
Subjective:  Patient ID: Susan Reeves, female    DOB: September 18, 1976  Age: 47 y.o. MRN: [id number]  CC: Chief Complaint  Patient presents with   Diabetes    Foot and hand numbness several times per day ,    Hypertension   Foot Pain    Spurs on heels for a couple of years referral to podiatry for surgery    HPI:  47 year old female with HTN, DM-2, HLD, Obesity, Tobacco abuse presents for follow-up.  Hypertension is stable on amlodipine.  Most recent A1c 6.9.  At goal.  Tolerating metformin.  Patient has had a dramatic improvement in her LDL.  Has been as high as 217 and most recent lipid panel revealed an LDL of 102.  She is tolerating Crestor.    Patient reports that she would like a referral to a podiatrist.  She has reported bone spurs on her heels which impact her ability to wear normal shoes.  She typically wears flip-flops.  Patient also reports ongoing numbness in the digits as well as the feet and toes.  No significant numbness at this time.   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   Smoker 12/06/2015    Social Hx   Social History   Socioeconomic History   Marital status: Married    Spouse name: Not on file   Number of children: Not on file   Years of education: Not on file   Highest education level: Not on file  Occupational History   Not on file  Tobacco Use   Smoking status: Every Day    Packs/day: 1.00    Years: 26.00    Total pack years: 26.00    Types: Cigarettes   Smokeless tobacco: Never  Vaping Use   Vaping Use: Never used  Substance and Sexual Activity   Alcohol use: No    Comment: not now   Drug use: No   Sexual activity: Yes    Birth control/protection: None  Other  Topics Concern   Not on file  Social History Narrative   Not on file   Social Determinants of Health   Financial Resource Strain: Not on file  Food Insecurity: Not on file  Transportation Needs: Not on file  Physical Activity: Not on file  Stress: Not on file  Social Connections: Not on file    Review of Systems Per HPI  Objective:  BP 138/80   Pulse 79   Temp 98.2 F (36.8 C)   Ht 5' 5.5" (1.664 m)   Wt 241 lb (109.3 kg)   SpO2 98%   BMI 39.49 kg/m      12/18/2022    9:21 AM 06/17/2022    9:15 AM 04/04/2022    9:40 AM  BP/Weight  Systolic BP 009 381 829  Diastolic BP 80 76 78  Wt. (Lbs) 241 240.4 242  BMI 39.49 kg/m2 39.4 kg/m2 39.66 kg/m2    Physical Exam Vitals and nursing note reviewed.  Constitutional:      General: She is not in acute distress.    Appearance: She is obese.  HENT:     Head: Normocephalic and atraumatic.  Cardiovascular:     Rate and Rhythm: Normal rate and regular rhythm.  Pulmonary:     Effort: Pulmonary effort is normal.     Breath sounds: Normal breath sounds. No wheezing, rhonchi or rales.  Neurological:     Mental Status: She is alert.  Psychiatric:        Mood and Affect: Mood normal.        Behavior: Behavior normal.     Lab Results  Component Value Date   WBC 7.3 03/05/2022   HGB 14.5 03/05/2022   HCT 42.7 03/05/2022   PLT 260 03/05/2022   GLUCOSE 135 (H) 03/05/2022   CHOL 176 12/17/2022   TRIG 160 (H) 12/17/2022   HDL 46 12/17/2022   LDLCALC 102 (H) 12/17/2022   ALT 16 03/05/2022   AST 22 03/05/2022   NA 134 03/05/2022   K 4.3 03/05/2022   CL 101 03/05/2022   CREATININE 0.65 03/05/2022   BUN 11 03/05/2022   CO2 25 03/05/2022   TSH 1.350 03/05/2022   HGBA1C 6.9 (H) 12/17/2022     Assessment & Plan:   Problem List Items Addressed This Visit       Cardiovascular and Mediastinum   Hypertension    Stable.  Continue amlodipine.        Endocrine   Type 2 diabetes mellitus without complications (HCC)     A3F at goal.  Continue metformin.        Other   Mixed hyperlipidemia    Likely has familial hyperlipidemia.  Has had dramatic response to Crestor.  Continue.      Bilateral foot pain    Referring to podiatry.      Relevant Orders   Ambulatory referral to Podiatry   Other Visit Diagnoses     Calcaneal spur, unspecified laterality       Relevant Orders   Ambulatory referral to Podiatry      Follow-up:  6 months  [place]

## 2022-12-25 ENCOUNTER — Ambulatory Visit (INDEPENDENT_AMBULATORY_CARE_PROVIDER_SITE_OTHER): Payer: 59 | Admitting: Podiatry

## 2022-12-25 ENCOUNTER — Ambulatory Visit (INDEPENDENT_AMBULATORY_CARE_PROVIDER_SITE_OTHER): Payer: 59

## 2022-12-25 DIAGNOSIS — M722 Plantar fascial fibromatosis: Secondary | ICD-10-CM | POA: Diagnosis not present

## 2022-12-25 DIAGNOSIS — M62461 Contracture of muscle, right lower leg: Secondary | ICD-10-CM

## 2022-12-25 DIAGNOSIS — M7661 Achilles tendinitis, right leg: Secondary | ICD-10-CM

## 2022-12-25 DIAGNOSIS — M7662 Achilles tendinitis, left leg: Secondary | ICD-10-CM

## 2022-12-25 DIAGNOSIS — M62462 Contracture of muscle, left lower leg: Secondary | ICD-10-CM

## 2022-12-25 NOTE — Patient Instructions (Addendum)
Look for Voltaren gel at the pharmacy over the counter or online (also known as diclofenac 1% gel). Apply to the painful areas 3-4x daily with the supplied dosing card. Allow to dry for 10 minutes before going into socks/shoes   Look for Tuli's Heel cups on [employer] to schedule PT: Main: [phone]   Plantar Fasciitis (Heel Spur Syndrome) with Rehab The plantar fascia is a fibrous, ligament-like, soft-tissue structure that spans the bottom of the foot. Plantar fasciitis is a condition that causes pain in the foot due to inflammation of the tissue. SYMPTOMS  Pain and tenderness on the underneath side of the foot. Pain that worsens with standing or walking. CAUSES  Plantar fasciitis is caused by irritation and injury to the plantar fascia on the underneath side of the foot. Common mechanisms of injury include: Direct trauma to bottom of the foot. Damage to a small nerve that runs under the foot where the main fascia attaches to the heel bone. Stress placed on the plantar fascia due to bone spurs. RISK INCREASES WITH:  Activities that place stress on the plantar fascia (running, jumping, pivoting, or cutting). Poor strength and flexibility. Improperly fitted shoes. Tight calf muscles. Flat feet. Failure to warm-up properly before activity. Obesity. PREVENTION Warm up and stretch properly before activity. Allow for adequate recovery between workouts. Maintain physical fitness: Strength, flexibility, and endurance. Cardiovascular fitness. Maintain a health body weight. Avoid stress on the plantar fascia. Wear properly fitted shoes, including arch supports for individuals who have flat feet.  PROGNOSIS  If treated properly, then the symptoms of plantar fasciitis usually resolve without surgery. However, occasionally surgery is necessary.  RELATED COMPLICATIONS  Recurrent symptoms that may result in a chronic condition. Problems of the lower back that are caused by  compensating for the injury, such as limping. Pain or weakness of the foot during push-off following surgery. Chronic inflammation, scarring, and partial or complete fascia tear, occurring more often from repeated injections.  TREATMENT  Treatment initially involves the use of ice and medication to help reduce pain and inflammation. The use of strengthening and stretching exercises may help reduce pain with activity, especially stretches of the Achilles tendon. These exercises may be performed at home or with a therapist. Your caregiver may recommend that you use heel cups of arch supports to help reduce stress on the plantar fascia. Occasionally, corticosteroid injections are given to reduce inflammation. If symptoms persist for greater than 6 months despite non-surgical (conservative), then surgery may be recommended.   MEDICATION  If pain medication is necessary, then nonsteroidal anti-inflammatory medications, such as aspirin and ibuprofen, or other minor pain relievers, such as acetaminophen, are often recommended. Do not take pain medication within 7 days before surgery. Prescription pain relievers may be given if deemed necessary by your caregiver. Use only as directed and only as much as you need. Corticosteroid injections may be given by your caregiver. These injections should be reserved for the most serious cases, because they may only be given a certain number of times.  HEAT AND COLD Cold treatment (icing) relieves pain and reduces inflammation. Cold treatment should be applied for 10 to 15 minutes every 2 to 3 hours for inflammation and pain and immediately after any activity that aggravates your symptoms. Use ice packs or massage the area with a piece of ice (ice massage). Heat treatment may be used prior to performing the stretching and strengthening activities prescribed by your caregiver, physical therapist, or athletic trainer. Use a heat  pack or soak the injury in warm  water.  SEEK IMMEDIATE MEDICAL CARE IF: Treatment seems to offer no benefit, or the condition worsens. Any medications produce adverse side effects.  EXERCISES- RANGE OF MOTION (ROM) AND STRETCHING EXERCISES - Plantar Fasciitis (Heel Spur Syndrome) These exercises may help you when beginning to rehabilitate your injury. Your symptoms may resolve with or without further involvement from your physician, physical therapist or athletic trainer. While completing these exercises, remember:  Restoring tissue flexibility helps normal motion to return to the joints. This allows healthier, less painful movement and activity. An effective stretch should be held for at least 30 seconds. A stretch should never be painful. You should only feel a gentle lengthening or release in the stretched tissue.  RANGE OF MOTION - Toe Extension, Flexion Sit with your right / left leg crossed over your opposite knee. Grasp your toes and gently pull them back toward the top of your foot. You should feel a stretch on the bottom of your toes and/or foot. Hold this stretch for 10 seconds. Now, gently pull your toes toward the bottom of your foot. You should feel a stretch on the top of your toes and or foot. Hold this stretch for 10 seconds. Repeat  times. Complete this stretch 3 times per day.   RANGE OF MOTION - Ankle Dorsiflexion, Active Assisted Remove shoes and sit on a chair that is preferably not on a carpeted surface. Place right / left foot under knee. Extend your opposite leg for support. Keeping your heel down, slide your right / left foot back toward the chair until you feel a stretch at your ankle or calf. If you do not feel a stretch, slide your bottom forward to the edge of the chair, while still keeping your heel down. Hold this stretch for 10 seconds. Repeat 3 times. Complete this stretch 2 times per day.   STRETCH  Gastroc, Standing Place hands on wall. Extend right / left leg, keeping the front knee  somewhat bent. Slightly point your toes inward on your back foot. Keeping your right / left heel on the floor and your knee straight, shift your weight toward the wall, not allowing your back to arch. You should feel a gentle stretch in the right / left calf. Hold this position for 10 seconds. Repeat 3 times. Complete this stretch 2 times per day.  STRETCH  Soleus, Standing Place hands on wall. Extend right / left leg, keeping the other knee somewhat bent. Slightly point your toes inward on your back foot. Keep your right / left heel on the floor, bend your back knee, and slightly shift your weight over the back leg so that you feel a gentle stretch deep in your back calf. Hold this position for 10 seconds. Repeat 3 times. Complete this stretch 2 times per day.  STRETCH  Gastrocsoleus, Standing  Note: This exercise can place a lot of stress on your foot and ankle. Please complete this exercise only if specifically instructed by your caregiver.  Place the ball of your right / left foot on a step, keeping your other foot firmly on the same step. Hold on to the wall or a rail for balance. Slowly lift your other foot, allowing your body weight to press your heel down over the edge of the step. You should feel a stretch in your right / left calf. Hold this position for 10 seconds. Repeat this exercise with a slight bend in your right / left knee.  Repeat 3 times. Complete this stretch 2 times per day.   STRENGTHENING EXERCISES - Plantar Fasciitis (Heel Spur Syndrome)  These exercises may help you when beginning to rehabilitate your injury. They may resolve your symptoms with or without further involvement from your physician, physical therapist or athletic trainer. While completing these exercises, remember:  Muscles can gain both the endurance and the strength needed for everyday activities through controlled exercises. Complete these exercises as instructed by your physician, physical  therapist or athletic trainer. Progress the resistance and repetitions only as guided.  STRENGTH - Towel Curls Sit in a chair positioned on a non-carpeted surface. Place your foot on a towel, keeping your heel on the floor. Pull the towel toward your heel by only curling your toes. Keep your heel on the floor. Repeat 3 times. Complete this exercise 2 times per day.  STRENGTH - Ankle Inversion Secure one end of a rubber exercise band/tubing to a fixed object (table, pole). Loop the other end around your foot just before your toes. Place your fists between your knees. This will focus your strengthening at your ankle. Slowly, pull your big toe up and in, making sure the band/tubing is positioned to resist the entire motion. Hold this position for 10 seconds. Have your muscles resist the band/tubing as it slowly pulls your foot back to the starting position. Repeat 3 times. Complete this exercises 2 times per day.  Document Released: 11/18/2005 Document Revised: 02/10/2012 Document Reviewed: 03/02/2009 [facility] Patient Information 2014 [place], [place].

## 2022-12-28 ENCOUNTER — Encounter: Payer: Self-pay | Admitting: Podiatry

## 2022-12-28 NOTE — Progress Notes (Signed)
  Subjective:  Patient ID: Susan Reeves, female    DOB: 07/20/1976,  MRN: [id number]  Chief Complaint  Patient presents with   Foot Pain    np Bilateral foot pain Calcaneal spur, unspecified laterality had xray a few years ago      47 y.o. female presents with the above complaint. History confirmed with patient.  Pain is worse when she gets up from seated or standing or in activity for a while  Objective:  Physical Exam: warm, good capillary refill, no trophic changes or ulcerative lesions, normal DP and PT pulses, and normal sensory exam. Left Foot: point tenderness over the heel pad, tenderness at Achilles tendon insertion, and gastrocnemius equinus is noted with a positive silverskiold test Right Foot: point tenderness over the heel pad, tenderness at Achilles tendon insertion, and gastrocnemius equinus is noted with a positive silverskiold test  No images are attached to the encounter.  Radiographs: Multiple views x-ray of both feet: no fracture, dislocation, swelling or degenerative changes noted, plantar calcaneal spur, and posterior calcaneal spur Assessment:   1. Plantar fasciitis, bilateral   2. Gastrocnemius equinus of left lower extremity   3. Gastrocnemius equinus of right lower extremity   4. Achilles tendonitis, bilateral      Plan:  Patient was evaluated and treated and all questions answered.   Discussed the etiology and treatment options for plantar fasciitis including stretching, formal physical therapy, supportive shoegears such as a running shoe or sneaker, pre fabricated orthoses, injection therapy, and oral medications. We also discussed the role of surgical treatment of this for patients who do not improve after exhausting non-surgical treatment options.   -XR reviewed with patient -Educated patient on stretching and icing of the affected limb -Injection delivered to the plantar fascia of both feet. -Recommended physical therapy, referral sent to [facility] outpatient physical therapy -She cannot tolerate NSAIDs and due to her diabetes would like to avoid methylprednisolone, she will use topical Voltaren gel  No follow-ups on file.

## 2023-01-03 ENCOUNTER — Other Ambulatory Visit: Payer: Self-pay

## 2023-01-03 ENCOUNTER — Ambulatory Visit (HOSPITAL_COMMUNITY): Payer: 59 | Attending: Podiatry

## 2023-01-03 DIAGNOSIS — M25571 Pain in right ankle and joints of right foot: Secondary | ICD-10-CM | POA: Diagnosis present

## 2023-01-03 DIAGNOSIS — M7661 Achilles tendinitis, right leg: Secondary | ICD-10-CM | POA: Diagnosis not present

## 2023-01-03 DIAGNOSIS — M25572 Pain in left ankle and joints of left foot: Secondary | ICD-10-CM

## 2023-01-03 DIAGNOSIS — M722 Plantar fascial fibromatosis: Secondary | ICD-10-CM | POA: Insufficient documentation

## 2023-01-03 DIAGNOSIS — M62461 Contracture of muscle, right lower leg: Secondary | ICD-10-CM | POA: Diagnosis not present

## 2023-01-03 DIAGNOSIS — M62462 Contracture of muscle, left lower leg: Secondary | ICD-10-CM | POA: Diagnosis not present

## 2023-01-03 DIAGNOSIS — M7662 Achilles tendinitis, left leg: Secondary | ICD-10-CM | POA: Diagnosis not present

## 2023-01-03 NOTE — Therapy (Addendum)
OUTPATIENT PHYSICAL THERAPY LOWER EXTREMITY EVALUATION   Patient Name: Susan Reeves MRN: [id number] DOB:Feb 21, 1976, 47 y.o., female Today's Date: 01/03/2023  END OF SESSION:  PT End of Session - 01/03/23 0955     Visit Number 1    Number of Visits 16    Date for PT Re-Evaluation 02/28/23    Authorization Type [employer], 30 visits limit, no auths required)    Authorization - Visit Number 1    Authorization - Number of Visits 30    Progress Note Due on Visit 8    PT Start Time 0900    PT Stop Time 0945    PT Time Calculation (min) 45 min    Activity Tolerance Patient tolerated treatment well             Past Medical History:  Diagnosis Date   AMA (advanced maternal age) multigravida 35+ 12/06/2015   Gestational diabetes    History of HPV infection    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   Pregnant 12/06/2015   Smoker 12/06/2015   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   Past Surgical History:  Procedure Laterality Date   THYROID SURGERY     Patient Active Problem List   Diagnosis Date Noted   Bilateral foot pain 12/19/2022   Migraine headache 04/07/2022   Type 2 diabetes mellitus without complications ([place]) 40/98/1191   Mixed hyperlipidemia 04/04/2022   Hidradenitis 10/16/2021   Hypertension 12/18/2018   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   Smoker 12/06/2015    PCP: Coral Spikes., DO  REFERRING PROVIDER: Criselda Peaches, DPM  REFERRING DIAG: M72.2 (ICD-10-CM) - Plantar fasciitis, bilateral M62.462 (ICD-10-CM) - Gastrocnemius equinus of left lower extremity M62.461 (ICD-10-CM) - Gastrocnemius equinus of right lower extremity M76.61,M76.62 (ICD-10-CM) - Achilles tendonitis, bilateral  THERAPY DIAG:  Pain in left ankle and joints of left foot - Plan: PT plan of care cert/re-cert  Pain in joint involving ankle and foot, right - Plan: PT plan of care cert/re-cert  Rationale for Evaluation and Treatment: Rehabilitation  ONSET DATE: Years  ago  SUBJECTIVE:   SUBJECTIVE STATEMENT: Patient reports that B feet are tingling. Currently reports of pain = 3/10. Condition started years ago and gradually got worse without reason. Never had PT in the past but had cortisone shots and shoe inserts. For years patient cannot wear sneakers and is just now wearing them after the cortisone shot that was just recently given. Patient was then referred to outpatient PT evaluation and management.  PERTINENT HISTORY: DM PAIN:  Are you having pain? Yes: NPRS scale: 3/10 Pain location: entire soles, constant Pain description: "tingly" Aggravating factors: morning, standing for 1 hour Relieving factors: unloading the foot  PRECAUTIONS: None  WEIGHT BEARING RESTRICTIONS: No  FALLS:  Has patient fallen in last 6 months? 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)  PLOF: Independent  PATIENT GOALS: "to be relieved of pain"  NEXT MD VISIT: 02/25/23  OBJECTIVE:   DIAGNOSTIC FINDINGS:  R/L foot X ray 12/25/22 Multiple views x-ray of both feet: no fracture, dislocation, swelling or degenerative changes noted, plantar calcaneal spur, and posterior calcaneal spur  PATIENT SURVEYS:  LEFS 27/80  COGNITION: Overall cognitive status: Within functional limits for tasks assessed     SENSATION: Not tested   MUSCLE LENGTH: Mild tight  on B gastrocnemius Moderate tightness on B soleus, plantar fascia, hamstrings, and piriformis  POSTURE:  Standing: slight genu varum, heightened medial longitudinal arch, supinated feet  PALPATION: Grade 2 tenderness on the lat border of the feet Grade 1 tenderness on medial arches  LOWER EXTREMITY ROM:  Active ROM Right eval Left eval  Hip flexion [facility]  Hip extension [facility]  Hip abduction [facility]  Hip  adduction [facility]  Hip internal rotation    Hip external rotation    Knee flexion [facility]  Knee extension [facility]  Ankle dorsiflexion (knee ext) 10 3  Ankle plantarflexion 50 40  Ankle inversion [facility]  Ankle eversion WFL WFL   (Blank rows = not tested)  LOWER EXTREMITY MMT:  MMT Right eval Left eval  Hip flexion 5 5  Hip extension 4 4  Hip abduction 4+ 4+  Hip adduction 3+ 3+  Hip internal rotation    Hip external rotation    Knee flexion 5 5  Knee extension 5 5  Ankle dorsiflexion  4+ 4+  Ankle plantarflexion 4+ 4+  Ankle inversion 5 5  Ankle eversion 4 4   (Blank rows = not tested)  LOWER EXTREMITY SPECIAL TESTS:  Ankle special tests: Thompson's test: negative  FUNCTIONAL TESTS:  2 minute walk test: 384 ft  GAIT: Distance walked: 384 Assistive device utilized: None Level of assistance: Complete Independence Comments: no gait deviations seen   TODAY'S TREATMENT:                                                                                                                              DATE: 01/03/23 Evaluation and HEP provided and reviewed Supine: Ankle alphabets x 1 set Seated:  Plantar fascia stretch x 30"  Toe yoga x 5   PATIENT EDUCATION:  Education details: Educated on the pathoanatomy of foot pain. Educated on the goals and course of rehab. Written HEP provided and reviewed Person educated: Patient Education method: Explanation, Demonstration, and Handouts Education comprehension: verbalized understanding and returned demonstration  HOME EXERCISE PROGRAM: Access Code: [email or web address] URL: [email or web address] Date: 01/03/2023 Prepared by: Rexene Alberts  Exercises - Seated Plantar Fascia Stretch  - 1-2 x daily - 5-7 x weekly - 3 reps - 30 hold - Seated Plantar Fascia Mobilization with Small Ball  - 1-2 x daily - 5-7 x weekly - Seated Ankle Alphabet  - 1-2 x daily - 5-7 x weekly - 1 sets - Toe Yoga - Alternating Great Toe and Lesser  Toe Extension  - 1-2 x daily - 5-7 x weekly - 2 sets - 10 reps  CLINICAL IMPRESSION: Patient is a 47 y.o. female who was seen today for physical therapy evaluation and treatment for plantar fasciitis and achilles tendonitis. Patient was diagnosed with achilles tendonitis and plantar fasciitis by referring provider further defined by difficulty in standing and prolonged walking due to pain, weakness, and decreased soft tissue extensibility. Additionally, working  on faulty biomechanics from the hips and knees may also be beneficial to prevent excessive feet supination Skilled PT is required to address the impairments and functional limitations listed below.   OBJECTIVE IMPAIRMENTS: decreased mobility, decreased strength, increased fascial restrictions, impaired flexibility, and pain.   ACTIVITY LIMITATIONS: standing and squatting  PARTICIPATION LIMITATIONS: shopping, community activity, and occupation  PERSONAL FACTORS: Time since onset of injury/illness/exacerbation are also affecting patient's functional outcome.   REHAB POTENTIAL: Good  CLINICAL DECISION MAKING: Stable/uncomplicated  EVALUATION COMPLEXITY: Low   GOALS: Goals reviewed with patient? Yes  SHORT TERM GOALS: Target date: 01/31/23 Ptwill demonstrate indep in HEP to facilitate carry-over of skilled services and improve functional outcomes Baseline: Goal status: INITIAL  2.  Pt will report decrease in pain to 1/10 to facilitate ease in ADLs Baseline: 3/10 Goal status: INITIAL  3.  Pt will demonstrate improved flexibility in the LEs to facilitate ease in ambulation and ADLs Baseline: Moderate to slight tightness Goal status: INITIAL   LONG TERM GOALS: Target date: 02/28/23  Pt will increase LEFS by at least 18 points in order to demonstrate significant improvement in lower extremity function.  Baseline: 27 Goal status: INITIAL  2.  Pt will demonstrate increase in LE strength to 4+/5 to facilitate ease and safety in  ambulation Baseline: 3+/5 Goal status: INITIAL  PLAN:  PT FREQUENCY: 2x/week  PT DURATION: 8 weeks  PLANNED INTERVENTIONS: Therapeutic exercises, Therapeutic activity, Neuromuscular re-education, Gait training, Patient/Family education, Self Care, Joint mobilization, Ionotophoresis 4mg /ml Dexamethasone, and Manual therapy  PLAN FOR NEXT SESSION: May add LE flexibility and strengthening to facilitate normal biomechanics   Maryn Freelove L. Haydyn Girvan, PT, DPT, OCS Board-Certified Clinical Specialist in [facility] # ([place]): [id number] T 01/03/2023, 1:10 PM

## 2023-01-08 ENCOUNTER — Ambulatory Visit (HOSPITAL_COMMUNITY): Payer: 59 | Admitting: Physical Therapy

## 2023-01-08 DIAGNOSIS — M25572 Pain in left ankle and joints of left foot: Secondary | ICD-10-CM | POA: Diagnosis not present

## 2023-01-08 DIAGNOSIS — M25571 Pain in right ankle and joints of right foot: Secondary | ICD-10-CM

## 2023-01-08 NOTE — Therapy (Signed)
OUTPATIENT PHYSICAL THERAPY LOWER EXTREMITY EVALUATION   Patient Name: Susan Reeves MRN: [id number] DOB:[birth year], 47 y.o., female Today's Date: 01/08/2023  END OF SESSION:  PT End of Session - 01/08/23 1114     Visit Number 2    Number of Visits 16    Date for PT Re-Evaluation 02/28/23    Authorization Type [employer], 30 visits limit, no auths required)    Authorization - Visit Number 2    Authorization - Number of Visits 30    Progress Note Due on Visit 8    PT Start Time 1116    PT Stop Time 2952    PT Time Calculation (min) 40 min    Activity Tolerance Patient tolerated treatment well    Behavior During Therapy WFL for tasks assessed/performed             Past Medical History:  Diagnosis Date   AMA (advanced maternal age) multigravida 35+ 12/06/2015   Gestational diabetes    History of HPV infection    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   Pregnant 12/06/2015   Smoker 12/06/2015   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   Past Surgical History:  Procedure Laterality Date   THYROID SURGERY     Patient Active Problem List   Diagnosis Date Noted   Bilateral foot pain 12/19/2022   Migraine headache 04/07/2022   Type 2 diabetes mellitus without complications ([place]) 84/13/2440   Mixed hyperlipidemia 04/04/2022   Hidradenitis 10/16/2021   Hypertension 12/18/2018   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   Smoker 12/06/2015    PCP: Coral Spikes., DO  REFERRING PROVIDER: Criselda Peaches, DPM  REFERRING DIAG: M72.2 (ICD-10-CM) - Plantar fasciitis, bilateral M62.462 (ICD-10-CM) - Gastrocnemius equinus of left lower extremity M62.461 (ICD-10-CM) - Gastrocnemius equinus of right lower extremity M76.61,M76.62 (ICD-10-CM) - Achilles tendonitis, bilateral  THERAPY DIAG:  Pain in left ankle and joints of left foot  Pain in joint involving ankle and foot, right  Rationale for Evaluation and Treatment: Rehabilitation  ONSET DATE: Years  ago  SUBJECTIVE:   SUBJECTIVE STATEMENT: Exercises going fine. Feels there may be some improvement already.   Patient reports that B feet are tingling. Currently reports of pain = 3/10. Condition started years ago and gradually got worse without reason. Never had PT in the past but had cortisone shots and shoe inserts. For years patient cannot wear sneakers and is just now wearing them after the cortisone shot that was just recently given. Patient was then referred to outpatient PT evaluation and management.  PERTINENT HISTORY: DM PAIN:  Are you having pain? Yes: NPRS scale: walking 4/10 Pain location: entire soles, constant Pain description: "tingly" Aggravating factors: morning, standing for 1 hour Relieving factors: unloading the foot  PRECAUTIONS: None  WEIGHT BEARING RESTRICTIONS: No  FALLS:  Has patient fallen in last 6 months? 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)  PLOF: Independent  PATIENT GOALS: "to be relieved of pain"  NEXT MD VISIT: 02/25/23  OBJECTIVE:   DIAGNOSTIC FINDINGS:  R/L foot X ray 12/25/22 Multiple views x-ray of both feet: no fracture, dislocation, swelling or degenerative changes noted, plantar calcaneal spur, and posterior calcaneal spur  PATIENT SURVEYS:  LEFS 27/80  COGNITION: Overall cognitive status: Within functional limits for tasks assessed  SENSATION: Not tested   MUSCLE LENGTH: Mild tight on B gastrocnemius Moderate tightness on B soleus, plantar fascia, hamstrings, and piriformis  POSTURE:  Standing: slight genu varum, heightened medial longitudinal arch, supinated feet  PALPATION: Grade 2 tenderness on the lat border of the feet Grade 1 tenderness on medial arches  LOWER EXTREMITY ROM:  Active ROM Right eval  Left eval  Hip flexion [facility]  Hip extension [facility]  Hip abduction [facility]  Hip adduction [facility]  Hip internal rotation    Hip external rotation    Knee flexion [facility]  Knee extension [facility]  Ankle dorsiflexion (knee ext) 10 3  Ankle plantarflexion 50 40  Ankle inversion [facility]  Ankle eversion WFL WFL   (Blank rows = not tested)  LOWER EXTREMITY MMT:  MMT Right eval Left eval  Hip flexion 5 5  Hip extension 4 4  Hip abduction 4+ 4+  Hip adduction 3+ 3+  Hip internal rotation    Hip external rotation    Knee flexion 5 5  Knee extension 5 5  Ankle dorsiflexion  4+ 4+  Ankle plantarflexion 4+ 4+  Ankle inversion 5 5  Ankle eversion 4 4   (Blank rows = not tested)  LOWER EXTREMITY SPECIAL TESTS:  Ankle special tests: Thompson's test: negative  FUNCTIONAL TESTS:  2 minute walk test: 384 ft  GAIT: Distance walked: 384 Assistive device utilized: None Level of assistance: Complete Independence Comments: no gait deviations seen   TODAY'S TREATMENT:                                                                                                                              DATE: 01/08/23 Plantar fascia stretch 3 x 30" each BAPS lv 2 DF/PF; INV/EV, CW x20 each  Ankle band 3 way RTB (DF/EV/INV) x20 each   01/03/23 Evaluation and HEP provided and reviewed Supine: Ankle alphabets x 1 set Seated:  Plantar fascia stretch x 30"  Toe yoga x 5   PATIENT EDUCATION:  Education details: Educated on the pathoanatomy of foot pain. Educated on the goals and course of rehab. Written HEP provided and reviewed Person educated: Patient Education method: Explanation, Demonstration, and Handouts Education comprehension: verbalized understanding and returned demonstration  HOME EXERCISE PROGRAM: Access Code:   URL: [email or web address]  01/08/23 - Long Sitting Plantar Fascia Stretch with Towel  - 1-2 x daily - 5-7 x weekly - 1 sets - 3 reps - 30 hold - Seated  Ankle Eversion with Resistance  - 1-2 x daily - 5-7 x weekly - 3 sets - 10 reps - Seated Ankle Inversion with Resistance and Legs Crossed  - 1-2 x daily - 5-7 x weekly - 3 sets - 10 reps - Seated Ankle Dorsiflexion with Resistance  - 1-2 x daily - 5-7 x weekly - 3 sets - 10 reps  Date: 01/03/2023 Prepared by: Rexene Alberts  Exercises - Seated Plantar Fascia  Stretch  - 1-2 x daily - 5-7 x weekly - 3 reps - 30 hold - Seated Plantar Fascia Mobilization with Small Ball  - 1-2 x daily - 5-7 x weekly - Seated Ankle Alphabet  - 1-2 x daily - 5-7 x weekly - 1 sets - Toe Yoga - Alternating Great Toe and Lesser Toe Extension  - 1-2 x daily - 5-7 x weekly - 2 sets - 10 reps  CLINICAL IMPRESSION: Patient noting slight increased foot pain with activity today. Educated patient on purpose and function of all added exercises and to perform in pain free ROM. Educated on importance of improved ankle control/ stability as well as strengthening ankle and intrinsic foot musculature for reducing pain. Updated HEP and issued handout. Patient with ongoing gait deviations likely contributing to foot pain. Patient will continue to benefit from skilled therapy services to reduce remaining deficits and improve functional ability.    OBJECTIVE IMPAIRMENTS: decreased mobility, decreased strength, increased fascial restrictions, impaired flexibility, and pain.   ACTIVITY LIMITATIONS: standing and squatting  PARTICIPATION LIMITATIONS: shopping, community activity, and occupation  PERSONAL FACTORS: Time since onset of injury/illness/exacerbation are also affecting patient's functional outcome.   REHAB POTENTIAL: Good  CLINICAL DECISION MAKING: Stable/uncomplicated  EVALUATION COMPLEXITY: Low   GOALS: Goals reviewed with patient? Yes  SHORT TERM GOALS: Target date: 01/31/23 Ptwill demonstrate indep in HEP to facilitate carry-over of skilled services and improve functional outcomes Baseline: Goal status:  INITIAL  2.  Pt will report decrease in pain to 1/10 to facilitate ease in ADLs Baseline: 3/10 Goal status: INITIAL  3.  Pt will demonstrate improved flexibility in the LEs to facilitate ease in ambulation and ADLs Baseline: Moderate to slight tightness Goal status: INITIAL   LONG TERM GOALS: Target date: 02/28/23  Pt will increase LEFS by at least 18 points in order to demonstrate significant improvement in lower extremity function.  Baseline: 27 Goal status: INITIAL  2.  Pt will demonstrate increase in LE strength to 4+/5 to facilitate ease and safety in ambulation Baseline: 3+/5 Goal status: INITIAL  PLAN:  PT FREQUENCY: 2x/week  PT DURATION: 8 weeks  PLANNED INTERVENTIONS: Therapeutic exercises, Therapeutic activity, Neuromuscular re-education, Gait training, Patient/Family education, Self Care, Joint mobilization, Ionotophoresis 4mg /ml Dexamethasone, and Manual therapy  PLAN FOR NEXT SESSION: May add LE flexibility and strengthening to facilitate normal biomechanics  11:15 AM, 01/08/23 Josue Hector PT DPT  Physical Therapist with [facility]  [phone]

## 2023-01-21 ENCOUNTER — Ambulatory Visit (HOSPITAL_COMMUNITY): Payer: 59 | Admitting: Physical Therapy

## 2023-01-21 DIAGNOSIS — M25572 Pain in left ankle and joints of left foot: Secondary | ICD-10-CM

## 2023-01-21 DIAGNOSIS — M25571 Pain in right ankle and joints of right foot: Secondary | ICD-10-CM

## 2023-01-21 NOTE — Therapy (Signed)
OUTPATIENT PHYSICAL THERAPY TREATMENT   Patient Name: Susan Reeves MRN: [id number] DOB:06-06-1976, 47 y.o., female Today's Date: 01/21/2023  END OF SESSION:  PT End of Session - 01/21/23 0952     Visit Number 3    Number of Visits 16    Date for PT Re-Evaluation 02/28/23    Authorization Type [employer], 30 visits limit, no auths required)    Authorization - Number of Visits 30    Progress Note Due on Visit 8    PT Start Time 0950    PT Stop Time 1028    PT Time Calculation (min) 38 min    Activity Tolerance Patient tolerated treatment well    Behavior During Therapy WFL for tasks assessed/performed             Past Medical History:  Diagnosis Date   AMA (advanced maternal age) multigravida 35+ 12/06/2015   Gestational diabetes    History of HPV infection    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   Pregnant 12/06/2015   Smoker 12/06/2015   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   Past Surgical History:  Procedure Laterality Date   THYROID SURGERY     Patient Active Problem List   Diagnosis Date Noted   Bilateral foot pain 12/19/2022   Migraine headache 04/07/2022   Type 2 diabetes mellitus without complications ([place]) [id number]   Mixed hyperlipidemia 04/04/2022   Hidradenitis 10/16/2021   Hypertension 12/18/2018   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   Smoker 12/06/2015    PCP: Coral Spikes., DO  REFERRING PROVIDER: Criselda Peaches, DPM  REFERRING DIAG: M72.2 (ICD-10-CM) - Plantar fasciitis, bilateral M62.462 (ICD-10-CM) - Gastrocnemius equinus of left lower extremity M62.461 (ICD-10-CM) - Gastrocnemius equinus of right lower extremity M76.61,M76.62 (ICD-10-CM) - Achilles tendonitis, bilateral  THERAPY DIAG:  Pain in left ankle and joints of left foot  Pain in joint involving ankle and foot, right  Rationale for Evaluation and Treatment: Rehabilitation  ONSET DATE: Years ago  SUBJECTIVE:   SUBJECTIVE STATEMENT: Pt reports she has  not been here as no appts avail.  Comes today in flip flops, brought socks to use for session. Doing her HEP.   Patient reports that B feet are tingling. Currently reports of pain = 3/10. Condition started years ago and gradually got worse without reason. Never had PT in the past but had cortisone shots and shoe inserts. For years patient cannot wear sneakers and is just now wearing them after the cortisone shot that was just recently given. Patient was then referred to outpatient PT evaluation and management.  PERTINENT HISTORY: DM PAIN:  Are you having pain? Yes: NPRS scale: walking 4/10 Pain location: entire soles, constant Pain description: "tingly" Aggravating factors: morning, standing for 1 hour Relieving factors: unloading the foot  PRECAUTIONS: None  WEIGHT BEARING RESTRICTIONS: No  FALLS:  Has patient fallen in last 6 months? 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)  PLOF: Independent  PATIENT GOALS: "to be relieved of pain"  NEXT MD VISIT: 02/25/23  OBJECTIVE:   DIAGNOSTIC FINDINGS:  R/L foot X ray 12/25/22 Multiple views x-ray of both feet: no fracture, dislocation, swelling or degenerative changes noted, plantar calcaneal spur, and posterior calcaneal spur  PATIENT SURVEYS:  LEFS 27/80  COGNITION: Overall cognitive status: Within functional limits for  tasks assessed     SENSATION: Not tested   MUSCLE LENGTH: Mild tight on B gastrocnemius Moderate tightness on B soleus, plantar fascia, hamstrings, and piriformis  POSTURE:  Standing: slight genu varum, heightened medial longitudinal arch, supinated feet  PALPATION: Grade 2 tenderness on the lat border of the feet Grade 1 tenderness on medial arches  LOWER EXTREMITY ROM:  Active ROM Right eval Left eval   Hip flexion [facility]  Hip extension [facility]  Hip abduction [facility]  Hip adduction [facility]  Hip internal rotation    Hip external rotation    Knee flexion [facility]  Knee extension [facility]  Ankle dorsiflexion (knee ext) 10 3  Ankle plantarflexion 50 40  Ankle inversion [facility]  Ankle eversion WFL WFL   (Blank rows = not tested)  LOWER EXTREMITY MMT:  MMT Right eval Left eval  Hip flexion 5 5  Hip extension 4 4  Hip abduction 4+ 4+  Hip adduction 3+ 3+  Hip internal rotation    Hip external rotation    Knee flexion 5 5  Knee extension 5 5  Ankle dorsiflexion  4+ 4+  Ankle plantarflexion 4+ 4+  Ankle inversion 5 5  Ankle eversion 4 4   (Blank rows = not tested)  LOWER EXTREMITY SPECIAL TESTS:  Ankle special tests: Thompson's test: negative  FUNCTIONAL TESTS:  2 minute walk test: 384 ft  GAIT: Distance walked: 384 Assistive device utilized: None Level of assistance: Complete Independence Comments: no gait deviations seen   TODAY'S TREATMENT:                                                                                                                              DATE: 01/21/23 Standing: heelraises 20X  Toeraises 20X  Plantar fascia stretches on step 3X30"  Vectors 10X5" each with 1 HHA Seated: BAPS lv 2 DF/PF; INV/EV, CW x20 each   Toe grasping of towel 1 minute each  01/08/23 Plantar fascia stretch 3 x 30" each BAPS lv 2 DF/PF; INV/EV, CW x20 each  Ankle band 3 way RTB (DF/EV/INV) x20 each   01/03/23 Evaluation and HEP provided and reviewed Supine: Ankle alphabets x 1 set Seated:  Plantar fascia stretch x 30"  Toe yoga x 5   PATIENT EDUCATION:  Education details: Educated on the pathoanatomy of foot pain. Educated on the goals and course of rehab. Written HEP provided and reviewed Person educated: Patient Education method: Explanation, Demonstration, and Handouts Education comprehension: verbalized understanding and returned demonstration  HOME  EXERCISE PROGRAM: Access Code: [email or web address] URL: [email or web address] Date: 01/21/2023 Prepared by: Roseanne Reno Exercises - Plantar Fascia Stretch on Step  - 2 x daily - 7 x weekly - 2 sets - 3 reps - 30 sec hold - Standing Heel Raise with Support  - 2 x daily - 7 x weekly - 2 sets - 10 reps - Standing Toe Raises at Chair  - 2  x daily - 7 x weekly - 2 sets - 10 reps  Access Code:   URL: [email or web address] 01/08/23 - Long Sitting Plantar Fascia Stretch with Towel  - 1-2 x daily - 5-7 x weekly - 1 sets - 3 reps - 30 hold - Seated Ankle Eversion with Resistance  - 1-2 x daily - 5-7 x weekly - 3 sets - 10 reps - Seated Ankle Inversion with Resistance and Legs Crossed  - 1-2 x daily - 5-7 x weekly - 3 sets - 10 reps - Seated Ankle Dorsiflexion with Resistance  - 1-2 x daily - 5-7 x weekly - 3 sets - 10 reps  Date: 01/03/2023 Prepared by: Rexene Alberts Exercises - Seated Plantar Fascia Stretch  - 1-2 x daily - 5-7 x weekly - 3 reps - 30 hold - Seated Plantar Fascia Mobilization with Small Ball  - 1-2 x daily - 5-7 x weekly - Seated Ankle Alphabet  - 1-2 x daily - 5-7 x weekly - 1 sets - Toe Yoga - Alternating Great Toe and Lesser Toe Extension  - 1-2 x daily - 5-7 x weekly - 2 sets - 10 reps  CLINICAL IMPRESSION: Progressed standing challenges this session.  Pt c/o pain after completing heel and toe raises.  Ensured this would improve as mm become stronger.  Instructed with standing plantar fascia stretch, which pt reported beneficial.  Educated on proper footwear and how this would help with supporting her arch and fascia.  Encouraged to go to foot store and get her foot analyzed and try on different footwear.  Began hip strengthening with added vector challenge this session.  Pt able to complete these with minimal rest breaks needed today.  Updated HEP and issued handout. Patient with ongoing gait deviations likely contributing to foot pain. Patient will continue to  benefit from skilled therapy services to reduce remaining deficits and improve functional ability.    OBJECTIVE IMPAIRMENTS: decreased mobility, decreased strength, increased fascial restrictions, impaired flexibility, and pain.   ACTIVITY LIMITATIONS: standing and squatting  PARTICIPATION LIMITATIONS: shopping, community activity, and occupation  PERSONAL FACTORS: Time since onset of injury/illness/exacerbation are also affecting patient's functional outcome.   REHAB POTENTIAL: Good  CLINICAL DECISION MAKING: Stable/uncomplicated  EVALUATION COMPLEXITY: Low   GOALS: Goals reviewed with patient? Yes  SHORT TERM GOALS: Target date: 01/31/23 Ptwill demonstrate indep in HEP to facilitate carry-over of skilled services and improve functional outcomes Baseline: Goal status: INITIAL  2.  Pt will report decrease in pain to 1/10 to facilitate ease in ADLs Baseline: 3/10 Goal status: INITIAL  3.  Pt will demonstrate improved flexibility in the LEs to facilitate ease in ambulation and ADLs Baseline: Moderate to slight tightness Goal status: INITIAL   LONG TERM GOALS: Target date: 02/28/23  Pt will increase LEFS by at least 18 points in order to demonstrate significant improvement in lower extremity function.  Baseline: 27 Goal status: INITIAL  2.  Pt will demonstrate increase in LE strength to 4+/5 to facilitate ease and safety in ambulation Baseline: 3+/5 Goal status: INITIAL  PLAN:  PT FREQUENCY: 2x/week  PT DURATION: 8 weeks  PLANNED INTERVENTIONS: Therapeutic exercises, Therapeutic activity, Neuromuscular re-education, Gait training, Patient/Family education, Self Care, Joint mobilization, Ionotophoresis 32m/ml Dexamethasone, and Manual therapy  PLAN FOR NEXT SESSION: May add LE flexibility and strengthening to facilitate normal biomechanics.   9:53 AM, 01/21/23 ATeena Irani PTA/CLT CBrightwoodPh: [phone]

## 2023-01-23 ENCOUNTER — Encounter (HOSPITAL_COMMUNITY): Payer: 59

## 2023-01-28 ENCOUNTER — Ambulatory Visit (HOSPITAL_COMMUNITY): Payer: 59

## 2023-01-28 DIAGNOSIS — M25572 Pain in left ankle and joints of left foot: Secondary | ICD-10-CM | POA: Diagnosis not present

## 2023-01-28 DIAGNOSIS — M25571 Pain in right ankle and joints of right foot: Secondary | ICD-10-CM

## 2023-01-28 NOTE — Therapy (Signed)
OUTPATIENT PHYSICAL THERAPY TREATMENT   Patient Name: Susan Reeves MRN: [id number] DOB:1976/10/03, 47 y.o., female Today's Date: 01/28/2023  END OF SESSION:  PT End of Session - 01/28/23 0942     Visit Number 4    Number of Visits 16    Date for PT Re-Evaluation 02/28/23    Authorization Type [employer], 30 visits limit, no auths required)    Authorization - Visit Number 3    Authorization - Number of Visits 30    Progress Note Due on Visit 8    PT Start Time 0945    PT Stop Time 1030    PT Time Calculation (min) 45 min    Activity Tolerance Patient tolerated treatment well    Behavior During Therapy WFL for tasks assessed/performed            Past Medical History:  Diagnosis Date   AMA (advanced maternal age) multigravida 35+ 12/06/2015   Gestational diabetes    History of HPV infection    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   Pregnant 12/06/2015   Smoker 12/06/2015   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   Past Surgical History:  Procedure Laterality Date   THYROID SURGERY     Patient Active Problem List   Diagnosis Date Noted   Bilateral foot pain 12/19/2022   Migraine headache 04/07/2022   Type 2 diabetes mellitus without complications ([place]) XX123456   Mixed hyperlipidemia 04/04/2022   Hidradenitis 10/16/2021   Hypertension 12/18/2018   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   Smoker 12/06/2015    PCP: Coral Spikes., DO  REFERRING PROVIDER: Criselda Peaches, DPM  REFERRING DIAG: M72.2 (ICD-10-CM) - Plantar fasciitis, bilateral M62.462 (ICD-10-CM) - Gastrocnemius equinus of left lower extremity M62.461 (ICD-10-CM) - Gastrocnemius equinus of right lower extremity M76.61,M76.62 (ICD-10-CM) - Achilles tendonitis, bilateral  THERAPY DIAG:  Pain in left ankle and joints of left foot  Pain in joint involving ankle and foot, right  Rationale for Evaluation and Treatment: Rehabilitation  ONSET DATE: Years ago  SUBJECTIVE:   SUBJECTIVE  STATEMENT: Pt reports that her pain is not as bad today. Claims that pain = 3/10 on NPRS  Patient reports that B feet are tingling. Currently reports of pain = 3/10. Condition started years ago and gradually got worse without reason. Never had PT in the past but had cortisone shots and shoe inserts. For years patient cannot wear sneakers and is just now wearing them after the cortisone shot that was just recently given. Patient was then referred to outpatient PT evaluation and management.  PERTINENT HISTORY: DM PAIN:  Are you having pain? Yes: NPRS scale: walking 3/10 Pain location: entire soles, constant Pain description: "tingly" Aggravating factors: morning, standing for 1 hour Relieving factors: unloading the foot  PRECAUTIONS: None  WEIGHT BEARING RESTRICTIONS: No  FALLS:  Has patient fallen in last 6 months? 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)  PLOF: Independent  PATIENT GOALS: "to be relieved of pain"  NEXT MD VISIT: 02/25/23  OBJECTIVE:   DIAGNOSTIC FINDINGS:  R/L foot X ray 12/25/22 Multiple views x-ray of both feet: no fracture, dislocation, swelling or degenerative changes noted, plantar calcaneal spur, and posterior calcaneal spur  PATIENT SURVEYS:  LEFS 27/80  COGNITION: Overall cognitive status: Within functional limits for tasks assessed  SENSATION: Not tested   MUSCLE LENGTH: Mild tight on B gastrocnemius Moderate tightness on B soleus, plantar fascia, hamstrings, and piriformis  POSTURE:  Standing: slight genu varum, heightened medial longitudinal arch, supinated feet  PALPATION: Grade 2 tenderness on the lat border of the feet Grade 1 tenderness on medial arches  LOWER EXTREMITY ROM:  Active ROM Right eval Left eval  Hip flexion [facility]   Hip extension [facility]  Hip abduction [facility]  Hip adduction [facility]  Hip internal rotation    Hip external rotation    Knee flexion [facility]  Knee extension [facility]  Ankle dorsiflexion (knee ext) 10 3  Ankle plantarflexion 50 40  Ankle inversion [facility]  Ankle eversion WFL WFL   (Blank rows = not tested)  LOWER EXTREMITY MMT:  MMT Right eval Left eval  Hip flexion 5 5  Hip extension 4 4  Hip abduction 4+ 4+  Hip adduction 3+ 3+  Hip internal rotation    Hip external rotation    Knee flexion 5 5  Knee extension 5 5  Ankle dorsiflexion  4+ 4+  Ankle plantarflexion 4+ 4+  Ankle inversion 5 5  Ankle eversion 4 4   (Blank rows = not tested)  LOWER EXTREMITY SPECIAL TESTS:  Ankle special tests: Thompson's test: negative  FUNCTIONAL TESTS:  2 minute walk test: 384 ft  GAIT: Distance walked: 384 Assistive device utilized: None Level of assistance: Complete Independence Comments: no gait deviations seen   TODAY'S TREATMENT:                                                                                                                              DATE: 01/28/23 Gastrocnemius slant board stretch x 30" x 3 Toe yoga with emphasis on the lat 4 toes x 2  x 10 Seated plantar fascia mob with a tennis ball x 1' Seated piriformis stretch x 30" x 3 Seated ankle alphabets x 1 set Ankle DF/PF, eversion, GTB, x 10  x 2 Seated hip adduction squeeze x 5" x 10 x 2 Standing plantar fascia stretch on a board x 30"  x 3 Lunges with lat pull from GTB x 3"  x 10 Standing trunk rot, GTB x 10  01/21/23 Standing: heelraises 20X  Toeraises 20X  Plantar fascia stretches on step 3X30"  Vectors 10X5" each with 1 HHA Seated: BAPS lv 2 DF/PF; INV/EV, CW x20 each   Toe grasping of towel 1 minute each  01/08/23 Plantar fascia stretch 3 x 30" each BAPS lv 2 DF/PF; INV/EV, CW x20 each  Ankle band 3 way RTB (DF/EV/INV) x20 each   01/03/23 Evaluation and HEP provided and  reviewed Supine: Ankle alphabets x 1 set Seated:  Plantar fascia stretch x 30"  Toe yoga x 5   PATIENT EDUCATION:  Education details: Educated on the pathoanatomy of foot pain. Educated on the goals and course of rehab. Written HEP provided  and reviewed Person educated: Patient Education method: Explanation, Demonstration, and Handouts Education comprehension: verbalized understanding and returned demonstration  HOME EXERCISE PROGRAM: Access Code: [email or web address] URL: [email or web address]  01/28/2023 - Toe Yoga - Alternating Great Toe and Lesser Toe Extension  - 2 x daily - 7 x weekly - 2 sets - 10 reps - Seated Plantar Fascia Mobilization with Small Ball  - 2 x daily - 7 x weekly - Seated Piriformis Stretch  - 2 x daily - 7 x weekly - 3 reps - 30 hold - Seated Ankle Alphabet  - 2 x daily - 7 x weekly - 1-2 sets - Ankle Dorsiflexion with Resistance  - 2 x daily - 7 x weekly - 2 sets - 10 reps - Ankle Eversion with Resistance  - 2 x daily - 7 x weekly - 2 sets - 10 reps - Ankle and Toe Plantarflexion with Resistance  - 2 x daily - 7 x weekly - 2 sets - 10 reps - Seated Hip Adduction Isometrics with Ball  - 2 x daily - 7 x weekly - 2 sets - 10 reps - 3 hold  Date: 01/21/2023 Prepared by: Roseanne Reno Exercises - Plantar Fascia Stretch on Step  - 2 x daily - 7 x weekly - 2 sets - 3 reps - 30 sec hold - Standing Heel Raise with Support  - 2 x daily - 7 x weekly - 2 sets - 10 reps - Standing Toe Raises at Chair  - 2 x daily - 7 x weekly - 2 sets - 10 reps  Access Code:   URL: [email or web address] 01/08/23 - Long Sitting Plantar Fascia Stretch with Towel  - 1-2 x daily - 5-7 x weekly - 1 sets - 3 reps - 30 hold - Seated Ankle Eversion with Resistance  - 1-2 x daily - 5-7 x weekly - 3 sets - 10 reps - Seated Ankle Inversion with Resistance and Legs Crossed  - 1-2 x daily - 5-7 x weekly - 3 sets - 10 reps - Seated Ankle Dorsiflexion with Resistance  - 1-2 x daily -  5-7 x weekly - 3 sets - 10 reps  Date: 01/03/2023 Prepared by: Rexene Alberts Exercises - Seated Plantar Fascia Stretch  - 1-2 x daily - 5-7 x weekly - 3 reps - 30 hold - Seated Plantar Fascia Mobilization with Small Ball  - 1-2 x daily - 5-7 x weekly - Seated Ankle Alphabet  - 1-2 x daily - 5-7 x weekly - 1 sets - Toe Yoga - Alternating Great Toe and Lesser Toe Extension  - 1-2 x daily - 5-7 x weekly - 2 sets - 10 reps  CLINICAL IMPRESSION: Interventions today were geared towards LE flexibility and strengthening. Tolerated all activities without slight increase in pain to 2-3/10. Demonstrated appropriate levels of fatigue. Required little to no amount of cueing to ensure correct execution of activity. To date, skilled PT is required to address the impairments and improve function.    OBJECTIVE IMPAIRMENTS: decreased mobility, decreased strength, increased fascial restrictions, impaired flexibility, and pain.   ACTIVITY LIMITATIONS: standing and squatting  PARTICIPATION LIMITATIONS: shopping, community activity, and occupation  PERSONAL FACTORS: Time since onset of injury/illness/exacerbation are also affecting patient's functional outcome.   REHAB POTENTIAL: Good  CLINICAL DECISION MAKING: Stable/uncomplicated  EVALUATION COMPLEXITY: Low   GOALS: Goals reviewed with patient? Yes  SHORT TERM GOALS: Target date: 01/31/23 Ptwill demonstrate indep in HEP to facilitate carry-over of skilled services and improve functional outcomes Baseline:  Goal status: INITIAL  2.  Pt will report decrease in pain to 1/10 to facilitate ease in ADLs Baseline: 3/10 Goal status: INITIAL  3.  Pt will demonstrate improved flexibility in the LEs to facilitate ease in ambulation and ADLs Baseline: Moderate to slight tightness Goal status: INITIAL   LONG TERM GOALS: Target date: 02/28/23  Pt will increase LEFS by at least 18 points in order to demonstrate significant improvement in lower extremity  function.  Baseline: 27 Goal status: INITIAL  2.  Pt will demonstrate increase in LE strength to 4+/5 to facilitate ease and safety in ambulation Baseline: 3+/5 Goal status: INITIAL  PLAN:  PT FREQUENCY: 2x/week  PT DURATION: 8 weeks  PLANNED INTERVENTIONS: Therapeutic exercises, Therapeutic activity, Neuromuscular re-education, Gait training, Patient/Family education, Self Care, Joint mobilization, Ionotophoresis '4mg'$ /ml Dexamethasone, and Manual therapy  PLAN FOR NEXT SESSION: Continue POC and may progress as tolerated with emphasis on LE flexibility and strengthening to facilitate normal biomechanics.   Harvie Heck. Khristi Schiller, PT, DPT, OCS Board-Certified Clinical Specialist in [place] # ([place]): [phone] T 9:48 AM, 01/28/23

## 2023-01-30 ENCOUNTER — Ambulatory Visit (HOSPITAL_COMMUNITY): Payer: 59

## 2023-01-30 DIAGNOSIS — M25572 Pain in left ankle and joints of left foot: Secondary | ICD-10-CM

## 2023-01-30 DIAGNOSIS — M25571 Pain in right ankle and joints of right foot: Secondary | ICD-10-CM

## 2023-01-30 NOTE — Therapy (Signed)
OUTPATIENT PHYSICAL THERAPY TREATMENT   Patient Name: Audriena Pizzimenti MRN: [id number] DOB:07-Apr-1976, 47 y.o., female Today's Date: 01/30/2023  END OF SESSION:  PT End of Session - 01/30/23 1000     Visit Number 5    Number of Visits 16    Date for PT Re-Evaluation 02/28/23    Authorization Type [employer], 30 visits limit, no auths required)    Authorization - Visit Number 4    Authorization - Number of Visits 30    Progress Note Due on Visit 8    PT Start Time 0945    PT Stop Time 1030    PT Time Calculation (min) 45 min    Activity Tolerance Patient tolerated treatment well    Behavior During Therapy WFL for tasks assessed/performed            Past Medical History:  Diagnosis Date   AMA (advanced maternal age) multigravida 35+ 12/06/2015   Gestational diabetes    History of HPV infection    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   Pregnant 12/06/2015   Smoker 12/06/2015   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   Past Surgical History:  Procedure Laterality Date   THYROID SURGERY     Patient Active Problem List   Diagnosis Date Noted   Bilateral foot pain 12/19/2022   Migraine headache 04/07/2022   Type 2 diabetes mellitus without complications ([place]) [id number]   Mixed hyperlipidemia 04/04/2022   Hidradenitis 10/16/2021   Hypertension 12/18/2018   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   Smoker 12/06/2015    PCP: Coral Spikes., DO  REFERRING PROVIDER: Criselda Peaches, DPM  REFERRING DIAG: M72.2 (ICD-10-CM) - Plantar fasciitis, bilateral M62.462 (ICD-10-CM) - Gastrocnemius equinus of left lower extremity M62.461 (ICD-10-CM) - Gastrocnemius equinus of right lower extremity M76.61,M76.62 (ICD-10-CM) - Achilles tendonitis, bilateral  THERAPY DIAG:  Pain in left ankle and joints of left foot  Pain in joint involving ankle and foot, right  Rationale for Evaluation and Treatment: Rehabilitation  ONSET DATE: Years ago  SUBJECTIVE:   SUBJECTIVE  STATEMENT: Pt reports that the L hip is hurting = 6/10 on NPRS. Patient states that the feet pain = 2/10 on NRPS.  Patient reports that B feet are tingling. Currently reports of pain = 3/10. Condition started years ago and gradually got worse without reason. Never had PT in the past but had cortisone shots and shoe inserts. For years patient cannot wear sneakers and is just now wearing them after the cortisone shot that was just recently given. Patient was then referred to outpatient PT evaluation and management.  PERTINENT HISTORY: DM PAIN:  Are you having pain? Yes: NPRS scale: walking 3/10 Pain location: entire soles, constant Pain description: "tingly" Aggravating factors: morning, standing for 1 hour Relieving factors: unloading the foot  PRECAUTIONS: None  WEIGHT BEARING RESTRICTIONS: No  FALLS:  Has patient fallen in last 6 months? 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)  PLOF: Independent  PATIENT GOALS: "to be relieved of pain"  NEXT MD VISIT: 02/25/23  OBJECTIVE:   DIAGNOSTIC FINDINGS:  R/L foot X ray 12/25/22 Multiple views x-ray of both feet: no fracture, dislocation, swelling or degenerative changes noted, plantar calcaneal spur, and posterior calcaneal spur  PATIENT SURVEYS:  LEFS 27/80  COGNITION: Overall cognitive status: Within functional  limits for tasks assessed     SENSATION: Not tested   MUSCLE LENGTH: Mild tight on B gastrocnemius Moderate tightness on B soleus, plantar fascia, hamstrings, and piriformis  POSTURE:  Standing: slight genu varum, heightened medial longitudinal arch, supinated feet  PALPATION: Grade 2 tenderness on the lat border of the feet Grade 1 tenderness on medial arches  LOWER EXTREMITY ROM:  Active ROM Right eval  Left eval  Hip flexion [facility]  Hip extension [facility]  Hip abduction [facility]  Hip adduction [facility]  Hip internal rotation    Hip external rotation    Knee flexion [facility]  Knee extension [facility]  Ankle dorsiflexion (knee ext) 10 3  Ankle plantarflexion 50 40  Ankle inversion [facility]  Ankle eversion WFL WFL   (Blank rows = not tested)  LOWER EXTREMITY MMT:  MMT Right eval Left eval  Hip flexion 5 5  Hip extension 4 4  Hip abduction 4+ 4+  Hip adduction 3+ 3+  Hip internal rotation    Hip external rotation    Knee flexion 5 5  Knee extension 5 5  Ankle dorsiflexion  4+ 4+  Ankle plantarflexion 4+ 4+  Ankle inversion 5 5  Ankle eversion 4 4   (Blank rows = not tested)  LOWER EXTREMITY SPECIAL TESTS:  Ankle special tests: Thompson's test: negative  FUNCTIONAL TESTS:  2 minute walk test: 384 ft  GAIT: Distance walked: 384 Assistive device utilized: None Level of assistance: Complete Independence Comments: no gait deviations seen   TODAY'S TREATMENT:                                                                                                                              DATE: 01/30/23 Gastrocnemius slant board stretch x 30" x 3 Seated soleus slant board stretch x 30" x 3 Toe yoga with emphasis on the lat 4 toes x 2  x 10 Seated plantar fascia mob with a tennis ball x 1' Seated piriformis stretch x 30" x 3 Seated ankle alphabets x 1 set Ankle DF/PF, eversion, GTB, x 10  x 2 Seated hip adduction squeeze x 5" x 10 x 2 Standing plantar fascia stretch on a board x 30"  x 3 Lunges with lat pull from GTB on the knee x 3"  x 10 x 2 Standing trunk rot, GTB x 10 x 2 on each   01/28/23 Gastrocnemius slant board stretch x 30" x 3 Toe yoga with emphasis on the lat 4 toes x 2  x 10 Seated plantar fascia mob with a tennis ball x 1' Seated piriformis stretch x 30" x 3 Seated ankle alphabets x 1 set Ankle DF/PF, eversion, GTB, x 10  x 2 Seated hip adduction squeeze x  5" x 10 x 2 Standing plantar fascia stretch on a board x 30"  x 3 Lunges with lat pull from GTB x 3"  x 10 Standing trunk  rot, GTB x 10  01/21/23 Standing: heelraises 20X  Toeraises 20X  Plantar fascia stretches on step 3X30"  Vectors 10X5" each with 1 HHA Seated: BAPS lv 2 DF/PF; INV/EV, CW x20 each   Toe grasping of towel 1 minute each  01/08/23 Plantar fascia stretch 3 x 30" each BAPS lv 2 DF/PF; INV/EV, CW x20 each  Ankle band 3 way RTB (DF/EV/INV) x20 each   01/03/23 Evaluation and HEP provided and reviewed Supine: Ankle alphabets x 1 set Seated:  Plantar fascia stretch x 30"  Toe yoga x 5   PATIENT EDUCATION:  Education details: Educated on the pathoanatomy of foot pain. Educated on the goals and course of rehab. Written HEP provided and reviewed Person educated: Patient Education method: Explanation, Demonstration, and Handouts Education comprehension: verbalized understanding and returned demonstration  HOME EXERCISE PROGRAM: Access Code: [email or web address] URL: [email or web address]  01/28/2023 - Toe Yoga - Alternating Great Toe and Lesser Toe Extension  - 2 x daily - 7 x weekly - 2 sets - 10 reps - Seated Plantar Fascia Mobilization with Small Ball  - 2 x daily - 7 x weekly - Seated Piriformis Stretch  - 2 x daily - 7 x weekly - 3 reps - 30 hold - Seated Ankle Alphabet  - 2 x daily - 7 x weekly - 1-2 sets - Ankle Dorsiflexion with Resistance  - 2 x daily - 7 x weekly - 2 sets - 10 reps - Ankle Eversion with Resistance  - 2 x daily - 7 x weekly - 2 sets - 10 reps - Ankle and Toe Plantarflexion with Resistance  - 2 x daily - 7 x weekly - 2 sets - 10 reps - Seated Hip Adduction Isometrics with Ball  - 2 x daily - 7 x weekly - 2 sets - 10 reps - 3 hold  Date: 01/21/2023 Prepared by: Roseanne Reno Exercises - Plantar Fascia Stretch on Step  - 2 x daily - 7 x weekly - 2 sets - 3 reps - 30 sec hold - Standing Heel Raise with Support  - 2 x daily - 7 x weekly - 2 sets  - 10 reps - Standing Toe Raises at Chair  - 2 x daily - 7 x weekly - 2 sets - 10 reps  Access Code:   URL: [email or web address] 01/08/23 - Long Sitting Plantar Fascia Stretch with Towel  - 1-2 x daily - 5-7 x weekly - 1 sets - 3 reps - 30 hold - Seated Ankle Eversion with Resistance  - 1-2 x daily - 5-7 x weekly - 3 sets - 10 reps - Seated Ankle Inversion with Resistance and Legs Crossed  - 1-2 x daily - 5-7 x weekly - 3 sets - 10 reps - Seated Ankle Dorsiflexion with Resistance  - 1-2 x daily - 5-7 x weekly - 3 sets - 10 reps  Date: 01/03/2023 Prepared by: Rexene Alberts Exercises - Seated Plantar Fascia Stretch  - 1-2 x daily - 5-7 x weekly - 3 reps - 30 hold - Seated Plantar Fascia Mobilization with Small Ball  - 1-2 x daily - 5-7 x weekly - Seated Ankle Alphabet  - 1-2 x daily - 5-7 x weekly - 1 sets - Toe Yoga - Alternating Great Toe and Lesser Toe Extension  - 1-2 x daily - 5-7 x weekly - 2 sets - 10 reps  CLINICAL IMPRESSION: Interventions today were geared towards LE flexibility and strengthening. Tolerated all activities without slight increase in pain on  the hips. Demonstrated appropriate levels of fatigue. Required little to no amount of cueing to ensure correct execution of activity. To date, skilled PT is required to address the impairments and improve function.   OBJECTIVE IMPAIRMENTS: decreased mobility, decreased strength, increased fascial restrictions, impaired flexibility, and pain.   ACTIVITY LIMITATIONS: standing and squatting  PARTICIPATION LIMITATIONS: shopping, community activity, and occupation  PERSONAL FACTORS: Time since onset of injury/illness/exacerbation are also affecting patient's functional outcome.   REHAB POTENTIAL: Good  CLINICAL DECISION MAKING: Stable/uncomplicated  EVALUATION COMPLEXITY: Low   GOALS: Goals reviewed with patient? Yes  SHORT TERM GOALS: Target date: 01/31/23 Ptwill demonstrate indep in HEP to facilitate  carry-over of skilled services and improve functional outcomes Baseline: Goal status: INITIAL  2.  Pt will report decrease in pain to 1/10 to facilitate ease in ADLs Baseline: 3/10 Goal status: INITIAL  3.  Pt will demonstrate improved flexibility in the LEs to facilitate ease in ambulation and ADLs Baseline: Moderate to slight tightness Goal status: INITIAL   LONG TERM GOALS: Target date: 02/28/23  Pt will increase LEFS by at least 18 points in order to demonstrate significant improvement in lower extremity function.  Baseline: 27 Goal status: INITIAL  2.  Pt will demonstrate increase in LE strength to 4+/5 to facilitate ease and safety in ambulation Baseline: 3+/5 Goal status: INITIAL  PLAN:  PT FREQUENCY: 2x/week  PT DURATION: 8 weeks  PLANNED INTERVENTIONS: Therapeutic exercises, Therapeutic activity, Neuromuscular re-education, Gait training, Patient/Family education, Self Care, Joint mobilization, Ionotophoresis '4mg'$ /ml Dexamethasone, and Manual therapy  PLAN FOR NEXT SESSION: Continue POC and may progress as tolerated with emphasis on LE flexibility and strengthening to facilitate normal biomechanics.   Harvie Heck. Jermani Pund, PT, DPT, OCS Board-Certified Clinical Specialist in [place] # ([place]): [id number] T 10:01 AM, 01/30/23

## 2023-02-05 ENCOUNTER — Ambulatory Visit (HOSPITAL_COMMUNITY): Payer: 59 | Attending: Podiatry

## 2023-02-05 DIAGNOSIS — M25572 Pain in left ankle and joints of left foot: Secondary | ICD-10-CM | POA: Insufficient documentation

## 2023-02-05 DIAGNOSIS — M25571 Pain in right ankle and joints of right foot: Secondary | ICD-10-CM | POA: Diagnosis present

## 2023-02-05 NOTE — Therapy (Signed)
OUTPATIENT PHYSICAL THERAPY TREATMENT   Patient Name: Mazi Bitting MRN: [id number] DOB:1976/01/07, 47 y.o., female Today's Date: 02/05/2023  END OF SESSION:  PT End of Session - 02/05/23 1123     Visit Number 6    Number of Visits 16    Date for PT Re-Evaluation 02/28/23    Authorization Type [employer], 30 visits limit, no auths required)    Authorization - Visit Number 5    Authorization - Number of Visits 30    Progress Note Due on Visit 8    PT Start Time 1120    PT Stop Time 1200    PT Time Calculation (min) 40 min    Activity Tolerance Patient tolerated treatment well    Behavior During Therapy WFL for tasks assessed/performed            Past Medical History:  Diagnosis Date   AMA (advanced maternal age) multigravida 35+ 12/06/2015   Gestational diabetes    History of HPV infection    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   Pregnant 12/06/2015   Smoker 12/06/2015   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   Past Surgical History:  Procedure Laterality Date   THYROID SURGERY     Patient Active Problem List   Diagnosis Date Noted   Bilateral foot pain 12/19/2022   Migraine headache 04/07/2022   Type 2 diabetes mellitus without complications ([place]) [id number]   Mixed hyperlipidemia 04/04/2022   Hidradenitis 10/16/2021   Hypertension 12/18/2018   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   Smoker 12/06/2015    PCP: Coral Spikes., DO  REFERRING PROVIDER: Criselda Peaches, DPM  REFERRING DIAG: M72.2 (ICD-10-CM) - Plantar fasciitis, bilateral M62.462 (ICD-10-CM) - Gastrocnemius equinus of left lower extremity M62.461 (ICD-10-CM) - Gastrocnemius equinus of right lower extremity M76.61,M76.62 (ICD-10-CM) - Achilles tendonitis, bilateral  THERAPY DIAG:  Pain in left ankle and joints of left foot  Pain in joint involving ankle and foot, right  Rationale for Evaluation and Treatment: Rehabilitation  ONSET DATE: Years ago  SUBJECTIVE:   SUBJECTIVE  STATEMENT: Patient denies any pain at the moment. Patient states that she doesn't have any pain because she has not done anything these past days because she got sick. However, patient states that she is feeling better now.  Patient reports that B feet are tingling. Currently reports of pain = 3/10. Condition started years ago and gradually got worse without reason. Never had PT in the past but had cortisone shots and shoe inserts. For years patient cannot wear sneakers and is just now wearing them after the cortisone shot that was just recently given. Patient was then referred to outpatient PT evaluation and management.  PERTINENT HISTORY: DM PAIN:  Are you having pain? Yes: NPRS scale: walking 3/10 Pain location: entire soles, constant Pain description: "tingly" Aggravating factors: morning, standing for 1 hour Relieving factors: unloading the foot  PRECAUTIONS: None  WEIGHT BEARING RESTRICTIONS: No  FALLS:  Has patient fallen in last 6 months? 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)  PLOF: Independent  PATIENT GOALS: "to be relieved of pain"  NEXT MD VISIT: 02/25/23  OBJECTIVE:   DIAGNOSTIC FINDINGS:  R/L foot X ray 12/25/22 Multiple views x-ray of both feet: no fracture, dislocation, swelling or degenerative changes noted, plantar calcaneal spur, and posterior  calcaneal spur  PATIENT SURVEYS:  LEFS 27/80  COGNITION: Overall cognitive status: Within functional limits for tasks assessed     SENSATION: Not tested   MUSCLE LENGTH: Mild tight on B gastrocnemius Moderate tightness on B soleus, plantar fascia, hamstrings, and piriformis  POSTURE:  Standing: slight genu varum, heightened medial longitudinal arch, supinated feet  PALPATION: Grade 2 tenderness on the lat  border of the feet Grade 1 tenderness on medial arches  LOWER EXTREMITY ROM:  Active ROM Right eval Left eval  Hip flexion [facility]  Hip extension [facility]  Hip abduction [facility]  Hip adduction [facility]  Hip internal rotation    Hip external rotation    Knee flexion [facility]  Knee extension [facility]  Ankle dorsiflexion (knee ext) 10 3  Ankle plantarflexion 50 40  Ankle inversion [facility]  Ankle eversion WFL WFL   (Blank rows = not tested)  LOWER EXTREMITY MMT:  MMT Right eval Left eval  Hip flexion 5 5  Hip extension 4 4  Hip abduction 4+ 4+  Hip adduction 3+ 3+  Hip internal rotation    Hip external rotation    Knee flexion 5 5  Knee extension 5 5  Ankle dorsiflexion  4+ 4+  Ankle plantarflexion 4+ 4+  Ankle inversion 5 5  Ankle eversion 4 4   (Blank rows = not tested)  LOWER EXTREMITY SPECIAL TESTS:  Ankle special tests: Thompson's test: negative  FUNCTIONAL TESTS:  2 minute walk test: 384 ft  GAIT: Distance walked: 384 Assistive device utilized: None Level of assistance: Complete Independence Comments: no gait deviations seen   TODAY'S TREATMENT:                                                                                                                              DATE: 02/05/23 Gastrocnemius slant board stretch x 30" x 3 Seated soleus slant board stretch x 30" x 3 Toe yoga with emphasis on the lat 4 toes x 2  x 10 Seated plantar fascia mob with a tennis ball x 1' Seated piriformis stretch x 30" x 3 Seated ankle alphabets x 1 set Ankle DF/PF, eversion, blue TB, x 10  x 2 Seated hip adduction squeeze x 5" x 10 x 2 Standing plantar fascia stretch on a board x 30"  x 3 Lunges with lat pull from blue TB on the knee x 3"  x 10 x 2 Standing trunk rot, blue TB x 10 x 2 on each  Standing BAPS board, level 2, medial x 10  01/30/23 Gastrocnemius slant board stretch x 30" x 3 Seated soleus slant board stretch x 30" x 3 Toe yoga with emphasis on the lat 4  toes x 2  x 10 Seated plantar fascia mob with a tennis ball x 1' Seated piriformis stretch x 30" x 3 Seated ankle alphabets x 1 set Ankle DF/PF, eversion, GTB, x 10  x 2  Seated hip adduction squeeze x 5" x 10 x 2 Standing plantar fascia stretch on a board x 30"  x 3 Lunges with lat pull from [place] on the knee x 3"  x 10 x 2 Standing trunk rot, GTB x 10 x 2 on each   01/28/23 Gastrocnemius slant board stretch x 30" x 3 Toe yoga with emphasis on the lat 4 toes x 2  x 10 Seated plantar fascia mob with a tennis ball x 1' Seated piriformis stretch x 30" x 3 Seated ankle alphabets x 1 set Ankle DF/PF, eversion, GTB, x 10  x 2 Seated hip adduction squeeze x 5" x 10 x 2 Standing plantar fascia stretch on a board x 30"  x 3 Lunges with lat pull from GTB x 3"  x 10 Standing trunk rot, GTB x 10  01/21/23 Standing: heelraises 20X  Toeraises 20X  Plantar fascia stretches on step 3X30"  Vectors 10X5" each with 1 HHA Seated: BAPS lv 2 DF/PF; INV/EV, CW x20 each   Toe grasping of towel 1 minute each  01/08/23 Plantar fascia stretch 3 x 30" each BAPS lv 2 DF/PF; INV/EV, CW x20 each  Ankle band 3 way RTB (DF/EV/INV) x20 each   01/03/23 Evaluation and HEP provided and reviewed Supine: Ankle alphabets x 1 set Seated:  Plantar fascia stretch x 30"  Toe yoga x 5   PATIENT EDUCATION:  Education details: Educated on the pathoanatomy of foot pain. Educated on the goals and course of rehab. Written HEP provided and reviewed Person educated: Patient Education method: Explanation, Demonstration, and Handouts Education comprehension: verbalized understanding and returned demonstration  HOME EXERCISE PROGRAM: Access Code: [email or web address] URL: [email or web address]  01/28/2023 - Toe Yoga - Alternating Great Toe and Lesser Toe Extension  - 2 x daily - 7 x weekly - 2 sets - 10 reps - Seated Plantar Fascia Mobilization with Small Ball  - 2 x daily - 7 x weekly - Seated Piriformis Stretch  - 2 x  daily - 7 x weekly - 3 reps - 30 hold - Seated Ankle Alphabet  - 2 x daily - 7 x weekly - 1-2 sets - Ankle Dorsiflexion with Resistance  - 2 x daily - 7 x weekly - 2 sets - 10 reps - Ankle Eversion with Resistance  - 2 x daily - 7 x weekly - 2 sets - 10 reps - Ankle and Toe Plantarflexion with Resistance  - 2 x daily - 7 x weekly - 2 sets - 10 reps - Seated Hip Adduction Isometrics with Ball  - 2 x daily - 7 x weekly - 2 sets - 10 reps - 3 hold  Date: 01/21/2023 Prepared by: Roseanne Reno Exercises - Plantar Fascia Stretch on Step  - 2 x daily - 7 x weekly - 2 sets - 3 reps - 30 sec hold - Standing Heel Raise with Support  - 2 x daily - 7 x weekly - 2 sets - 10 reps - Standing Toe Raises at Chair  - 2 x daily - 7 x weekly - 2 sets - 10 reps  Access Code:   URL: [email or web address] 01/08/23 - Long Sitting Plantar Fascia Stretch with Towel  - 1-2 x daily - 5-7 x weekly - 1 sets - 3 reps - 30 hold - Seated Ankle Eversion with Resistance  - 1-2 x daily - 5-7 x weekly - 3 sets - 10 reps - Seated Ankle Inversion with Resistance and Legs Crossed  - 1-2 x  daily - 5-7 x weekly - 3 sets - 10 reps - Seated Ankle Dorsiflexion with Resistance  - 1-2 x daily - 5-7 x weekly - 3 sets - 10 reps  Date: 01/03/2023 Prepared by: Rexene Alberts Exercises - Seated Plantar Fascia Stretch  - 1-2 x daily - 5-7 x weekly - 3 reps - 30 hold - Seated Plantar Fascia Mobilization with Small Ball  - 1-2 x daily - 5-7 x weekly - Seated Ankle Alphabet  - 1-2 x daily - 5-7 x weekly - 1 sets - Toe Yoga - Alternating Great Toe and Lesser Toe Extension  - 1-2 x daily - 5-7 x weekly - 2 sets - 10 reps  CLINICAL IMPRESSION: Interventions today were geared towards LE flexibility and strengthening. Tolerated all activities without any pain or worsening symptoms. Demonstrated appropriate levels of fatigue. Required little to no amount of cueing to ensure correct execution of activity with good carry-over but slight  difficulty noted on the BAPS due to impaired proprioception and weakness. To date, skilled PT is required to address the impairments and improve function.   OBJECTIVE IMPAIRMENTS: decreased mobility, decreased strength, increased fascial restrictions, impaired flexibility, and pain.   ACTIVITY LIMITATIONS: standing and squatting  PARTICIPATION LIMITATIONS: shopping, community activity, and occupation  PERSONAL FACTORS: Time since onset of injury/illness/exacerbation are also affecting patient's functional outcome.   REHAB POTENTIAL: Good  CLINICAL DECISION MAKING: Stable/uncomplicated  EVALUATION COMPLEXITY: Low   GOALS: Goals reviewed with patient? Yes  SHORT TERM GOALS: Target date: 01/31/23 Ptwill demonstrate indep in HEP to facilitate carry-over of skilled services and improve functional outcomes Baseline: Goal status: INITIAL  2.  Pt will report decrease in pain to 1/10 to facilitate ease in ADLs Baseline: 3/10 Goal status: INITIAL  3.  Pt will demonstrate improved flexibility in the LEs to facilitate ease in ambulation and ADLs Baseline: Moderate to slight tightness Goal status: INITIAL   LONG TERM GOALS: Target date: 02/28/23  Pt will increase LEFS by at least 18 points in order to demonstrate significant improvement in lower extremity function.  Baseline: 27 Goal status: INITIAL  2.  Pt will demonstrate increase in LE strength to 4+/5 to facilitate ease and safety in ambulation Baseline: 3+/5 Goal status: INITIAL  PLAN:  PT FREQUENCY: 2x/week  PT DURATION: 8 weeks  PLANNED INTERVENTIONS: Therapeutic exercises, Therapeutic activity, Neuromuscular re-education, Gait training, Patient/Family education, Self Care, Joint mobilization, Ionotophoresis '4mg'$ /ml Dexamethasone, and Manual therapy  PLAN FOR NEXT SESSION: Continue POC and may progress as tolerated with emphasis on LE flexibility and strengthening to facilitate normal biomechanics.   Harvie Heck. [place],  PT, DPT, OCS Board-Certified Clinical Specialist in [place] # ([place]): [id number] T 12:36 PM, 02/05/23

## 2023-02-19 ENCOUNTER — Ambulatory Visit (HOSPITAL_COMMUNITY): Payer: 59

## 2023-02-19 DIAGNOSIS — M25572 Pain in left ankle and joints of left foot: Secondary | ICD-10-CM | POA: Diagnosis not present

## 2023-02-19 DIAGNOSIS — M25571 Pain in right ankle and joints of right foot: Secondary | ICD-10-CM

## 2023-02-19 NOTE — Therapy (Addendum)
OUTPATIENT PHYSICAL THERAPY TREATMENT   Patient Name: Susan Reeves MRN: [id number] DOB:02/16/1976, 47 y.o., female Today's Date: 02/19/2023  END OF SESSION:  PT End of Session - 02/19/23 0956     Visit Number 7    Number of Visits 16    Date for PT Re-Evaluation 02/28/23    Authorization Type [employer], 30 visits limit, no auths required)    Authorization - Visit Number 6    Authorization - Number of Visits 30    Progress Note Due on Visit 8    PT Start Time 0950    PT Stop Time 1030    PT Time Calculation (min) 40 min    Activity Tolerance Patient tolerated treatment well    Behavior During Therapy [facility] for tasks assessed/performed             Past Medical History:  Diagnosis Date   AMA (advanced maternal age) multigravida 35+ 12/06/2015   Gestational diabetes    History of HPV infection    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   Pregnant 12/06/2015   Smoker 12/06/2015   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   Past Surgical History:  Procedure Laterality Date   THYROID SURGERY     Patient Active Problem List   Diagnosis Date Noted   Bilateral foot pain 12/19/2022   Migraine headache 04/07/2022   Type 2 diabetes mellitus without complications ([place]) [id number]   Mixed hyperlipidemia 04/04/2022   Hidradenitis 10/16/2021   Hypertension 12/18/2018   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   Smoker 12/06/2015    PCP: Coral Spikes., DO  REFERRING PROVIDER: Criselda Peaches, DPM  REFERRING DIAG: M72.2 (ICD-10-CM) - Plantar fasciitis, bilateral M62.462 (ICD-10-CM) - Gastrocnemius equinus of left lower extremity M62.461 (ICD-10-CM) - Gastrocnemius equinus of right lower extremity M76.61,M76.62 (ICD-10-CM) - Achilles tendonitis, bilateral  THERAPY DIAG:  Pain in joint involving ankle and foot, right  Pain in left ankle and joints of left foot  Rationale for Evaluation and Treatment: Rehabilitation  ONSET DATE: Years ago  SUBJECTIVE:    SUBJECTIVE STATEMENT: Patient denies any pain on feet. Patient states that the feet don't hurt as much as it used to be when she stands for prolonged periods. However, the hips and the back are hurting, with L hip worse than the R. Current pain on the back and hips = 4-5/10. Patient states that the back started to hurt since she started to wear her sneakers last week.  Patient reports that B feet are tingling. Currently reports of pain = 3/10. Condition started years ago and gradually got worse without reason. Never had PT in the past but had cortisone shots and shoe inserts. For years patient cannot wear sneakers and is just now wearing them after the cortisone shot that was just recently given. Patient was then referred to outpatient PT evaluation and management.  PERTINENT HISTORY: DM PAIN:  Are you having pain? Yes: NPRS scale: walking 3/10 Pain location: entire soles, constant Pain description: "tingly" Aggravating factors: morning, standing for 1 hour Relieving factors: unloading the foot  PRECAUTIONS: None  WEIGHT BEARING RESTRICTIONS: No  FALLS:  Has patient fallen in last 6 months? 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)  PLOF: Independent  PATIENT GOALS: "to be relieved of pain"  NEXT MD VISIT: 02/25/23  OBJECTIVE:   DIAGNOSTIC FINDINGS:  R/L foot X ray 12/25/22 Multiple views x-ray of both feet: no fracture, dislocation, swelling or degenerative changes noted, plantar calcaneal spur, and posterior calcaneal spur  PATIENT SURVEYS:  LEFS 27/80  COGNITION: Overall cognitive status: Within functional limits for tasks assessed     SENSATION: Not tested   MUSCLE LENGTH: Mild tight on B gastrocnemius Moderate tightness on B soleus, plantar fascia, hamstrings, and  piriformis  POSTURE:  Standing: slight genu varum, heightened medial longitudinal arch, supinated feet  PALPATION: Grade 2 tenderness on the lat border of the feet Grade 1 tenderness on medial arches  LOWER EXTREMITY ROM:  Active ROM Right eval Left eval  Hip flexion [facility]  Hip extension [facility]  Hip abduction [facility]  Hip adduction [facility]  Hip internal rotation    Hip external rotation    Knee flexion [facility]  Knee extension [facility]  Ankle dorsiflexion (knee ext) 10 3  Ankle plantarflexion 50 40  Ankle inversion [facility]  Ankle eversion WFL WFL   (Blank rows = not tested)  LOWER EXTREMITY MMT:  MMT Right eval Left eval  Hip flexion 5 5  Hip extension 4 4  Hip abduction 4+ 4+  Hip adduction 3+ 3+  Hip internal rotation    Hip external rotation    Knee flexion 5 5  Knee extension 5 5  Ankle dorsiflexion  4+ 4+  Ankle plantarflexion 4+ 4+  Ankle inversion 5 5  Ankle eversion 4 4   (Blank rows = not tested)  LOWER EXTREMITY SPECIAL TESTS:  Ankle special tests: Thompson's test: negative  FUNCTIONAL TESTS:  2 minute walk test: 384 ft  GAIT: Distance walked: 384 Assistive device utilized: None Level of assistance: Complete Independence Comments: no gait deviations seen   TODAY'S TREATMENT:                                                                                                                              DATE: 02/19/23 Gastrocnemius slant board stretch x 30" x 3 Seated soleus slant board stretch x 30" x 3 Toe yoga with emphasis on the lat 4 toes x 2  x 10 Seated plantar fascia mob with a tennis ball x 1' Seated piriformis stretch x 30" x 3 Seated ankle alphabets x 1 set Ankle DF/PF, eversion, blue TB, x 10  x 2 Seated hip adduction squeeze x 5" x 10 x 2 Standing plantar fascia stretch on a board x 30"  x 3 Lunges with lat pull from blue TB on the knee x 3"  x 10 x 2 Standing trunk rot, blue TB x 10 x 2 on each  Standing BAPS board, level 2,  medial x 10 x 2  02/05/23 Gastrocnemius slant board stretch x 30" x 3 Seated soleus slant board stretch x 30" x 3 Toe yoga with emphasis on the lat 4 toes x 2  x 10  Seated plantar fascia mob with a tennis ball x 1' Seated piriformis stretch x 30" x 3 Seated ankle alphabets x 1 set Ankle DF/PF, eversion, blue TB, x 10  x 2 Seated hip adduction squeeze x 5" x 10 x 2 Standing plantar fascia stretch on a board x 30"  x 3 Lunges with lat pull from blue TB on the knee x 3"  x 10 x 2 Standing trunk rot, blue TB x 10 x 2 on each  Standing BAPS board, level 2, medial x 10  01/30/23 Gastrocnemius slant board stretch x 30" x 3 Seated soleus slant board stretch x 30" x 3 Toe yoga with emphasis on the lat 4 toes x 2  x 10 Seated plantar fascia mob with a tennis ball x 1' Seated piriformis stretch x 30" x 3 Seated ankle alphabets x 1 set Ankle DF/PF, eversion, GTB, x 10  x 2 Seated hip adduction squeeze x 5" x 10 x 2 Standing plantar fascia stretch on a board x 30"  x 3 Lunges with lat pull from GTB on the knee x 3"  x 10 x 2 Standing trunk rot, GTB x 10 x 2 on each   01/28/23 Gastrocnemius slant board stretch x 30" x 3 Toe yoga with emphasis on the lat 4 toes x 2  x 10 Seated plantar fascia mob with a tennis ball x 1' Seated piriformis stretch x 30" x 3 Seated ankle alphabets x 1 set Ankle DF/PF, eversion, GTB, x 10  x 2 Seated hip adduction squeeze x 5" x 10 x 2 Standing plantar fascia stretch on a board x 30"  x 3 Lunges with lat pull from GTB x 3"  x 10 Standing trunk rot, GTB x 10  01/21/23 Standing: heelraises 20X  Toeraises 20X  Plantar fascia stretches on step 3X30"  Vectors 10X5" each with 1 HHA Seated: BAPS lv 2 DF/PF; INV/EV, CW x20 each   Toe grasping of towel 1 minute each  01/08/23 Plantar fascia stretch 3 x 30" each BAPS lv 2 DF/PF; INV/EV, CW x20 each  Ankle band 3 way RTB (DF/EV/INV) x20 each   01/03/23 Evaluation and HEP provided and reviewed Supine: Ankle  alphabets x 1 set Seated:  Plantar fascia stretch x 30"  Toe yoga x 5   PATIENT EDUCATION:  Education details: Educated on the pathoanatomy of foot pain. Educated on the goals and course of rehab. Written HEP provided and reviewed Person educated: Patient Education method: Explanation, Demonstration, and Handouts Education comprehension: verbalized understanding and returned demonstration  HOME EXERCISE PROGRAM: Access Code: [email or web address] URL: [email or web address]  01/28/2023 - Toe Yoga - Alternating Great Toe and Lesser Toe Extension  - 2 x daily - 7 x weekly - 2 sets - 10 reps - Seated Plantar Fascia Mobilization with Small Ball  - 2 x daily - 7 x weekly - Seated Piriformis Stretch  - 2 x daily - 7 x weekly - 3 reps - 30 hold - Seated Ankle Alphabet  - 2 x daily - 7 x weekly - 1-2 sets - Ankle Dorsiflexion with Resistance  - 2 x daily - 7 x weekly - 2 sets - 10 reps - Ankle Eversion with Resistance  - 2 x daily - 7 x weekly - 2 sets - 10 reps - Ankle and Toe Plantarflexion with Resistance  - 2 x daily - 7 x weekly - 2 sets - 10 reps - Seated Hip Adduction Isometrics with Ball  - 2 x  daily - 7 x weekly - 2 sets - 10 reps - 3 hold  Date: 01/21/2023 Prepared by: Roseanne Reno Exercises - Plantar Fascia Stretch on Step  - 2 x daily - 7 x weekly - 2 sets - 3 reps - 30 sec hold - Standing Heel Raise with Support  - 2 x daily - 7 x weekly - 2 sets - 10 reps - Standing Toe Raises at Chair  - 2 x daily - 7 x weekly - 2 sets - 10 reps  Access Code:   URL: [email or web address] 01/08/23 - Long Sitting Plantar Fascia Stretch with Towel  - 1-2 x daily - 5-7 x weekly - 1 sets - 3 reps - 30 hold - Seated Ankle Eversion with Resistance  - 1-2 x daily - 5-7 x weekly - 3 sets - 10 reps - Seated Ankle Inversion with Resistance and Legs Crossed  - 1-2 x daily - 5-7 x weekly - 3 sets - 10 reps - Seated Ankle Dorsiflexion with Resistance  - 1-2 x daily - 5-7 x weekly - 3 sets -  10 reps  Date: 01/03/2023 Prepared by: Rexene Alberts Exercises - Seated Plantar Fascia Stretch  - 1-2 x daily - 5-7 x weekly - 3 reps - 30 hold - Seated Plantar Fascia Mobilization with Small Ball  - 1-2 x daily - 5-7 x weekly - Seated Ankle Alphabet  - 1-2 x daily - 5-7 x weekly - 1 sets - Toe Yoga - Alternating Great Toe and Lesser Toe Extension  - 1-2 x daily - 5-7 x weekly - 2 sets - 10 reps  CLINICAL IMPRESSION: Interventions today were geared towards LE flexibility and strengthening. Tolerated all activities without any pain or worsening symptoms. Demonstrated appropriate levels of fatigue. Required little to no amount of cueing to ensure correct execution of activity with good carry-over. Little to no difficulty noted on the [employer]. Patient states that the plantar fascia stretch doesn't hurt. To date, skilled PT is required to address the impairments and improve function.   OBJECTIVE IMPAIRMENTS: decreased mobility, decreased strength, increased fascial restrictions, impaired flexibility, and pain.   ACTIVITY LIMITATIONS: standing and squatting  PARTICIPATION LIMITATIONS: shopping, community activity, and occupation  PERSONAL FACTORS: Time since onset of injury/illness/exacerbation are also affecting patient's functional outcome.   REHAB POTENTIAL: Good  CLINICAL DECISION MAKING: Stable/uncomplicated  EVALUATION COMPLEXITY: Low   GOALS: Goals reviewed with patient? Yes  SHORT TERM GOALS: Target date: 01/31/23 Ptwill demonstrate indep in HEP to facilitate carry-over of skilled services and improve functional outcomes Baseline: Goal status: INITIAL  2.  Pt will report decrease in pain to 1/10 to facilitate ease in ADLs Baseline: 3/10 Goal status: INITIAL  3.  Pt will demonstrate improved flexibility in the LEs to facilitate ease in ambulation and ADLs Baseline: Moderate to slight tightness Goal status: INITIAL   LONG TERM GOALS: Target date: 02/28/23  Pt will  increase LEFS by at least 18 points in order to demonstrate significant improvement in lower extremity function.  Baseline: 27 Goal status: INITIAL  2.  Pt will demonstrate increase in LE strength to 4+/5 to facilitate ease and safety in ambulation Baseline: 3+/5 Goal status: INITIAL  PLAN:  PT FREQUENCY: 2x/week  PT DURATION: 8 weeks  PLANNED INTERVENTIONS: Therapeutic exercises, Therapeutic activity, Neuromuscular re-education, Gait training, Patient/Family education, Self Care, Joint mobilization, Ionotophoresis 4mg /ml Dexamethasone, and Manual therapy  PLAN FOR NEXT SESSION: Re-assess next visit.  Harvie Heck. Simrah Chatham, PT, DPT, OCS Board-Certified Clinical Specialist  in [place] # ([place]): [id number] T 10:27 AM, 02/19/23

## 2023-02-25 ENCOUNTER — Ambulatory Visit (INDEPENDENT_AMBULATORY_CARE_PROVIDER_SITE_OTHER): Payer: 59 | Admitting: Podiatry

## 2023-02-25 DIAGNOSIS — M722 Plantar fascial fibromatosis: Secondary | ICD-10-CM | POA: Diagnosis not present

## 2023-02-25 NOTE — Progress Notes (Signed)
  Subjective:  Patient ID: Susan Reeves, female    DOB: 30-Sep-1976,  MRN: [id number]  Chief Complaint  Patient presents with   Plantar Fasciitis    2 month follow up bilateral -injections and PT helped a lot -doing much better - now having trouble with hips and knees    47 y.o. female presents with the above complaint. History confirmed with patient.  Doing much better physical therapy has helped quite a bit. Objective:  Physical Exam: warm, good capillary refill, no trophic changes or ulcerative lesions, normal DP and PT pulses, and normal sensory exam.  No pain on either heel today, improvement in gastrocnemius equinus   No images are attached to the encounter.  Radiographs: Multiple views x-ray of both feet: no fracture, dislocation, swelling or degenerative changes noted, plantar calcaneal spur, and posterior calcaneal spur Assessment:   1. Plantar fasciitis, bilateral      Plan:  Patient was evaluated and treated and all questions answered.  Overall doing much better she has her final visit with therapy tomorrow.  Recommend she continue daily stretching exercises long-term.  Return to see me as needed if it does not improve or worsens.  No follow-ups on file.

## 2023-02-26 ENCOUNTER — Ambulatory Visit (HOSPITAL_COMMUNITY): Payer: 59

## 2023-02-26 DIAGNOSIS — M25572 Pain in left ankle and joints of left foot: Secondary | ICD-10-CM | POA: Diagnosis not present

## 2023-02-26 DIAGNOSIS — M25571 Pain in right ankle and joints of right foot: Secondary | ICD-10-CM

## 2023-02-26 NOTE — Therapy (Addendum)
OUTPATIENT PHYSICAL THERAPY DISCHARGE/PROGRESS NOTE   Patient Name: Susan Reeves MRN: [id number] DOB:09/18/1976, 47 y.o., female Today's Date: 02/26/2023  PHYSICAL THERAPY DISCHARGE SUMMARY  Visits from Start of Care: 8  Current functional level related to goals / functional outcomes: See below   Remaining deficits: See below   Education / Equipment: See below   Patient agrees to discharge. Patient goals were partially met. Patient is being discharged due to  goals partially met and patient can just continue her exercises at home.  Progress Note Reporting Period 01/03/2023 to 02/26/2023  See note below for Objective Data and Assessment of Progress/Goals.     END OF SESSION:  PT End of Session - 02/26/23 1009     Visit Number 8    Number of Visits 16    Date for PT Re-Evaluation 02/28/23    Authorization Type [employer], 30 visits limit, no auths required)    Authorization - Visit Number 7    Authorization - Number of Visits 30    Progress Note Due on Visit 8    PT Start Time 0945    PT Stop Time 1025    PT Time Calculation (min) 40 min    Activity Tolerance Patient tolerated treatment well    Behavior During Therapy WFL for tasks assessed/performed            Past Medical History:  Diagnosis Date   AMA (advanced maternal age) multigravida 35+ 12/06/2015   Gestational diabetes    History of HPV infection    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   Pregnant 12/06/2015   Smoker 12/06/2015   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   Past Surgical History:  Procedure Laterality Date   THYROID SURGERY     Patient Active Problem List   Diagnosis Date Noted   Bilateral foot pain 12/19/2022   Migraine headache 04/07/2022   Type 2 diabetes mellitus without complications ([place]) [id number]   Mixed hyperlipidemia 04/04/2022   Hidradenitis 10/16/2021   Hypertension 12/18/2018   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 Patient states that she can now stand for a couple of hours but her feet will hurt for around 2-3/10. Patient thinks that PT has helped her a lot and thinks that she's done with PT.  Patient reports that B feet are tingling. Currently reports of pain = 3/10. Condition started years ago and gradually got worse without reason. Never had PT in the past but had cortisone shots and shoe inserts. For years patient cannot wear sneakers and is just now wearing them after the cortisone shot that was just recently given. Patient was then referred to outpatient PT evaluation and management.  PERTINENT HISTORY: DM PAIN:  Are you having pain? Yes: NPRS scale: walking 3/10 Pain location: entire soles, constant Pain description: "tingly" Aggravating factors: morning, standing for 1 hour Relieving factors: unloading the foot  PRECAUTIONS: None  WEIGHT BEARING RESTRICTIONS: No  FALLS:  Has patient fallen in last 6  months? 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)  PLOF: Independent  PATIENT GOALS: "to be relieved of pain"  NEXT MD VISIT: 02/25/23  OBJECTIVE:   DIAGNOSTIC FINDINGS:  R/L foot X ray 12/25/22 Multiple views x-ray of both feet: no fracture, dislocation,  swelling or degenerative changes noted, plantar calcaneal spur, and posterior calcaneal spur  PATIENT SURVEYS:  LEFS 59/80 from 27/80  COGNITION: Overall cognitive status: Within functional limits for tasks assessed     SENSATION: Not tested   MUSCLE LENGTH: Slight tightness on B gastrocnemius Slight tightness on B soleus, plantar fascia, hamstrings Mild  tightness on B piriformis  POSTURE:  Standing: slight genu varum, heightened medial longitudinal arch, supinated feet  PALPATION: No tenderness on the medial and lat aspect of B feet except the for grade 1 tenderness on R heel  LOWER EXTREMITY ROM:  Active ROM Right eval Left eval Right 02/26/23 Left 02/26/23  Hip flexion [facility] WFL  Hip extension [facility] WFL WFL  Hip abduction [facility] WFL  Hip adduction [facility] WFL  Hip internal rotation      Hip external rotation      Knee flexion [facility] WFL  Knee extension [facility] WFL  Ankle dorsiflexion (knee ext) 10 3 10  (past 0) 10 (past 0)  Ankle plantarflexion 50 40 60 60  Ankle inversion [facility] WFL  Ankle eversion [facility] WFL WFL   (Blank rows = not tested)  LOWER EXTREMITY MMT:  MMT Right eval Left eval Right 02/26/23 Left 02/26/23  Hip flexion 5 5 5 5   Hip extension 4 4 5 5   Hip abduction 4+ 4+ 5 5  Hip adduction 3+ 3+ 3+ 3+  Hip internal rotation      Hip external rotation      Knee flexion 5 5 5 5   Knee extension 5 5 5 5   Ankle dorsiflexion  4+ 4+ 5 5  Ankle plantarflexion 4+ 4+ 5 5  Ankle inversion 5 5 5 5   Ankle eversion 4 4 5 5    (Blank rows = not tested)  LOWER EXTREMITY SPECIAL TESTS:  Ankle special tests: Thompson's test: negative  FUNCTIONAL TESTS:  2 minute walk test: 474 ft from 384 ft  GAIT: Distance walked:474 ft Assistive device utilized: None Level of assistance: Complete Independence Comments: no gait deviations seen   TODAY'S TREATMENT:                                                                                                                               DATE: 02/26/23 Progress note (LEFS, 2MWT, ROM, MMT, palpation) Gastrocnemius slant board stretch x 30" x 3 Seated soleus slant board stretch x 30" x 3 Toe yoga with emphasis on the  lat 4 toes x 2  x 10 Seated plantar fascia mob with a tennis ball x 1' Seated ankle alphabets x 1 set Ankle DF/PF, eversion, blue TB, x 10  x 2 Seated hip adduction squeeze x 5" x 10 x 2   02/19/23 Gastrocnemius slant board stretch x 30" x 3 Seated soleus slant board stretch x 30" x 3 Toe yoga with emphasis on the lat 4 toes x 2  x 10 Seated plantar fascia mob with a tennis ball x 1' Seated piriformis stretch x 30" x 3 Seated ankle alphabets x 1 set Ankle DF/PF, eversion, blue TB, x 10  x 2 Seated hip adduction squeeze x 5" x 10 x 2 Standing plantar fascia stretch on a board x 30"  x 3 Lunges with lat pull from blue TB on the knee x 3"  x 10 x 2 Standing trunk rot, blue TB x 10 x 2 on each  Standing BAPS board, level 2, medial x 10 x 2  02/05/23 Gastrocnemius slant board stretch x 30" x 3 Seated soleus slant board stretch x 30" x 3 Toe yoga with emphasis on the lat 4 toes x 2  x 10 Seated plantar fascia mob with a tennis ball x 1' Seated piriformis stretch x 30" x 3 Seated ankle alphabets x 1 set Ankle DF/PF, eversion, blue TB, x 10  x 2 Seated hip adduction squeeze x 5" x 10 x 2 Standing plantar fascia stretch on a board x 30"  x 3 Lunges with lat pull from blue TB on the knee x 3"  x 10 x 2 Standing trunk rot, blue TB x 10 x 2 on each  Standing BAPS board, level 2, medial x 10  01/30/23 Gastrocnemius slant board stretch x 30" x 3 Seated soleus slant board stretch x 30" x 3 Toe yoga with emphasis on the lat 4 toes x 2  x 10 Seated plantar fascia mob with a tennis ball x 1' Seated piriformis stretch x 30" x 3 Seated ankle alphabets x 1 set Ankle DF/PF, eversion, GTB, x 10  x 2 Seated hip adduction squeeze x 5" x 10 x 2 Standing plantar  fascia stretch on a board x 30"  x 3 Lunges with lat pull from [place] on the knee x 3"  x 10 x 2 Standing trunk rot, GTB x 10 x 2 on each   01/28/23 Gastrocnemius slant board stretch x 30" x 3 Toe yoga with emphasis on the lat 4 toes x 2  x 10 Seated plantar fascia mob with a tennis ball x 1' Seated piriformis stretch x 30" x 3 Seated ankle alphabets x 1 set Ankle DF/PF, eversion, GTB, x 10  x 2 Seated hip adduction squeeze x 5" x 10 x 2 Standing plantar fascia stretch on a board x 30"  x 3 Lunges with lat pull from GTB x 3"  x 10 Standing trunk rot, GTB x 10  01/21/23 Standing: heelraises 20X  Toeraises 20X  Plantar fascia stretches on step 3X30"  Vectors 10X5" each with 1 HHA Seated: BAPS lv 2 DF/PF; INV/EV, CW x20 each   Toe grasping of towel 1 minute each  01/08/23 Plantar fascia stretch 3 x 30" each BAPS lv 2 DF/PF; INV/EV, CW x20 each  Ankle band 3 way RTB (DF/EV/INV) x20 each   01/03/23 Evaluation and HEP provided and reviewed Supine: Ankle alphabets x 1 set Seated:  Plantar fascia stretch x 30"  Toe yoga x 5  PATIENT EDUCATION:  Education details: Continue with HEP for 4 weeks then taper to 3-5x/week the month after Person educated: Patient Education method: Customer service manager Education comprehension: verbalized understanding and returned demonstration  HOME EXERCISE PROGRAM: Access Code: [email or web address] URL: [email or web address]  01/28/2023 - Toe Yoga - Alternating Great Toe and Lesser Toe Extension  - 2 x daily - 7 x weekly - 2 sets - 10 reps - Seated Plantar Fascia Mobilization with Small Ball  - 2 x daily - 7 x weekly - Seated Piriformis Stretch  - 2 x daily - 7 x weekly - 3 reps - 30 hold - Seated Ankle Alphabet  - 2 x daily - 7 x weekly - 1-2 sets - Ankle Dorsiflexion with Resistance  - 2 x daily - 7 x weekly - 2 sets - 10 reps - Ankle Eversion with Resistance  - 2 x daily - 7 x weekly - 2 sets - 10 reps - Ankle and Toe Plantarflexion with  Resistance  - 2 x daily - 7 x weekly - 2 sets - 10 reps - Seated Hip Adduction Isometrics with Ball  - 2 x daily - 7 x weekly - 2 sets - 10 reps - 3 hold  Date: 01/21/2023 Prepared by: Roseanne Reno Exercises - Plantar Fascia Stretch on Step  - 2 x daily - 7 x weekly - 2 sets - 3 reps - 30 sec hold - Standing Heel Raise with Support  - 2 x daily - 7 x weekly - 2 sets - 10 reps - Standing Toe Raises at Chair  - 2 x daily - 7 x weekly - 2 sets - 10 reps  Access Code:   URL: [email or web address] 01/08/23 - Long Sitting Plantar Fascia Stretch with Towel  - 1-2 x daily - 5-7 x weekly - 1 sets - 3 reps - 30 hold - Seated Ankle Eversion with Resistance  - 1-2 x daily - 5-7 x weekly - 3 sets - 10 reps - Seated Ankle Inversion with Resistance and Legs Crossed  - 1-2 x daily - 5-7 x weekly - 3 sets - 10 reps - Seated Ankle Dorsiflexion with Resistance  - 1-2 x daily - 5-7 x weekly - 3 sets - 10 reps  Date: 01/03/2023 Prepared by: Rexene Alberts Exercises - Seated Plantar Fascia Stretch  - 1-2 x daily - 5-7 x weekly - 3 reps - 30 hold - Seated Plantar Fascia Mobilization with Small Ball  - 1-2 x daily - 5-7 x weekly - Seated Ankle Alphabet  - 1-2 x daily - 5-7 x weekly - 1 sets - Toe Yoga - Alternating Great Toe and Lesser Toe Extension  - 1-2 x daily - 5-7 x weekly - 2 sets - 10 reps  CLINICAL IMPRESSION: Patient demonstrated continued improvements in function as indicated by positive significant changes in 2MWT, LEFS, ROM, and MMT. Though patient still presents with deficits in flexibility and some strength deficits, patient can just continue with her exercises at home. With this, skilled PT is not required to address the impairments. Interventions today were geared towards LE flexibility and strengthening. Tolerated all activities without any pain or worsening symptoms. Demonstrated appropriate levels of fatigue. Required no amount of cueing to ensure correct execution of  activity.   OBJECTIVE IMPAIRMENTS: decreased strength and impaired flexibility.   ACTIVITY LIMITATIONS: standing and squatting  PARTICIPATION LIMITATIONS: occupation  PERSONAL FACTORS: Time since onset of injury/illness/exacerbation are also affecting patient's functional outcome.   REHAB POTENTIAL: Good  CLINICAL DECISION MAKING: Stable/uncomplicated  EVALUATION COMPLEXITY: Low   GOALS: Goals reviewed with patient? Yes  SHORT TERM GOALS: Target date: 01/31/23 Ptwill demonstrate indep in HEP to facilitate carry-over of skilled services and improve functional outcomes Baseline: Goal status: MET  2.  Pt will report decrease in pain to 1/10 to facilitate ease in ADLs Baseline: 3/10 Goal status: MET  3.  Pt will demonstrate improved flexibility in the LEs to facilitate ease in ambulation and ADLs Baseline: Moderate to slight tightness Goal status: PARTIALLY MET   LONG TERM GOALS: Target date: 02/28/23  Pt will increase LEFS by at least 18 points in order to demonstrate significant improvement in lower extremity function.  Baseline: 27 Goal status: MET  2.  Pt will demonstrate increase in LE strength to 4+/5 to facilitate ease and safety in ambulation Baseline: 3+/5 Goal status: PARTIALLY MET  PLAN:  PT FREQUENCY:  0  PT DURATION: other: 0  PLANNED INTERVENTIONS:  Skilled PT not required at this time. D/C to indep HEP  PLAN FOR NEXT SESSION: Re-assess next visit.  Harvie Heck. Alaa Eyerman, PT, DPT, OCS Board-Certified Clinical Specialist in [place] # ([place]): [phone] T 10:10 AM, 02/26/23

## 2023-03-04 ENCOUNTER — Encounter (HOSPITAL_COMMUNITY): Payer: 59

## 2023-03-06 ENCOUNTER — Encounter (HOSPITAL_COMMUNITY): Payer: 59

## 2023-03-11 ENCOUNTER — Encounter (HOSPITAL_COMMUNITY): Payer: 59

## 2023-03-12 ENCOUNTER — Other Ambulatory Visit: Payer: Self-pay | Admitting: Family Medicine

## 2023-06-17 ENCOUNTER — Other Ambulatory Visit: Payer: Self-pay

## 2023-06-17 ENCOUNTER — Other Ambulatory Visit: Payer: Self-pay | Admitting: Family Medicine

## 2023-06-17 DIAGNOSIS — E119 Type 2 diabetes mellitus without complications: Secondary | ICD-10-CM

## 2023-06-17 DIAGNOSIS — E782 Mixed hyperlipidemia: Secondary | ICD-10-CM

## 2023-06-17 DIAGNOSIS — I10 Essential (primary) hypertension: Secondary | ICD-10-CM

## 2023-06-18 ENCOUNTER — Encounter: Payer: Self-pay | Admitting: Family Medicine

## 2023-06-18 ENCOUNTER — Ambulatory Visit: Payer: 59 | Admitting: Family Medicine

## 2023-06-18 VITALS — BP 151/88 | HR 81 | Temp 98.2°F | Ht 65.5 in | Wt 245.0 lb

## 2023-06-18 DIAGNOSIS — G4482 Headache associated with sexual activity: Secondary | ICD-10-CM

## 2023-06-18 DIAGNOSIS — E119 Type 2 diabetes mellitus without complications: Secondary | ICD-10-CM | POA: Diagnosis not present

## 2023-06-18 DIAGNOSIS — R002 Palpitations: Secondary | ICD-10-CM

## 2023-06-18 DIAGNOSIS — F17200 Nicotine dependence, unspecified, uncomplicated: Secondary | ICD-10-CM | POA: Diagnosis not present

## 2023-06-18 LAB — COMPREHENSIVE METABOLIC PANEL
ALT: 15 IU/L (ref 0–32)
AST: 16 IU/L (ref 0–40)
Albumin: 4.3 g/dL (ref 3.9–4.9)
Alkaline Phosphatase: 91 IU/L (ref 44–121)
BUN/Creatinine Ratio: 17 (ref 9–23)
BUN: 11 mg/dL (ref 6–24)
Bilirubin Total: 0.5 mg/dL (ref 0.0–1.2)
CO2: 25 mmol/L (ref 20–29)
Calcium: 9.5 mg/dL (ref 8.7–10.2)
Chloride: 99 mmol/L (ref 96–106)
Creatinine, Ser: 0.63 mg/dL (ref 0.57–1.00)
Globulin, Total: 2.5 g/dL (ref 1.5–4.5)
Glucose: 147 mg/dL — ABNORMAL HIGH (ref 70–99)
Potassium: 4.4 mmol/L (ref 3.5–5.2)
Sodium: 138 mmol/L (ref 134–144)
Total Protein: 6.8 g/dL (ref 6.0–8.5)
eGFR: 109 mL/min/{1.73_m2} (ref >59)

## 2023-06-18 LAB — MICROALBUMIN / CREATININE URINE RATIO
Creatinine, Urine: 77.1 mg/dL
Microalb/Creat Ratio: 6 mg/g creat (ref 0–29)
Microalbumin, Urine: 4.7 ug/mL

## 2023-06-18 LAB — LIPID PANEL
Chol/HDL Ratio: 5.3 ratio — ABNORMAL HIGH (ref 0.0–4.4)
Cholesterol, Total: 228 mg/dL — ABNORMAL HIGH (ref 100–199)
HDL: 43 mg/dL (ref >39)
LDL Chol Calc (NIH): 150 mg/dL — ABNORMAL HIGH (ref 0–99)
Triglycerides: 190 mg/dL — ABNORMAL HIGH (ref 0–149)
VLDL Cholesterol Cal: 35 mg/dL (ref 5–40)

## 2023-06-18 LAB — HEMOGLOBIN A1C
Est. average glucose Bld gHb Est-mCnc: 171 mg/dL
Hgb A1c MFr Bld: 7.6 % — ABNORMAL HIGH (ref 4.8–5.6)

## 2023-06-18 MED ORDER — PROPRANOLOL HCL 40 MG PO TABS: 40.0000 mg | ORAL_TABLET | Freq: Every day | ORAL | 1 refills | Status: DC

## 2023-06-18 MED ORDER — TIRZEPATIDE 2.5 MG/0.5ML ~~LOC~~ SOAJ: 2.5000 mg | SUBCUTANEOUS | 0 refills | Status: DC

## 2023-06-18 NOTE — Patient Instructions (Signed)
Medications sent.  Referral placed.  Follow up in 3 months.

## 2023-06-19 DIAGNOSIS — G4482 Headache associated with sexual activity: Secondary | ICD-10-CM | POA: Insufficient documentation

## 2023-06-19 DIAGNOSIS — R002 Palpitations: Secondary | ICD-10-CM | POA: Insufficient documentation

## 2023-06-19 NOTE — Assessment & Plan Note (Signed)
Has history of migraine headache.  He has recently been having headaches associated with sexual activity.  Placing on propranolol for prophylaxis.

## 2023-06-19 NOTE — Assessment & Plan Note (Signed)
Uncontrolled.  Advised compliance with metformin.  Given obesity will start Mounjaro.

## 2023-06-19 NOTE — Assessment & Plan Note (Signed)
Referring to cardiology for Holter.

## 2023-06-19 NOTE — Progress Notes (Signed)
Subjective:  Patient ID: Susan Reeves, female    DOB: 1976-03-17  Age: 47 y.o. MRN: [id number]  CC: Chief Complaint  Patient presents with   Hypertension    Patient has been experiencing heart racing and headaches with orgasms - not taking metformin and cholesterol meds for 1 month due to missing morning medications  Reports reading 202/101 at home with heart racing    HPI:  47 year old female with history of migraine headache, hypertension, type 2 diabetes, hidradenitis, tobacco abuse, hyperlipidemia presents for follow-up.  Patient reports that recently she has been experiencing palpitations.  She reports 3-4 episodes in the past month.  Heart races.  This last for few minutes and then resolves spontaneously.  Patient also notes that she has been experiencing headaches after orgasm during sexual intercourse.  These are quite bothersome for her.  Lasts briefly then resolves.  She has a history of migraine headache.  Patient's blood pressure elevated today.  Patient states that she has recently realized that she has not been taking all of her medication as she should.  Patient reports that she has been forgetting to take her cholesterol medication.  As a result, her LDL on recent labs was uncontrolled.  Additionally, she has been forgetting to take her second dose of metformin.  A1c 7.6.  Will discuss this today.  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   Smoker 12/06/2015    Social Hx   Social History   Socioeconomic History   Marital status: Married    Spouse name: Not on file   Number of children: Not on file   Years of education: Not on file   Highest  education level: Not on file  Occupational History   Not on file  Tobacco Use   Smoking status: Every Day    Current packs/day: 1.00    Average packs/day: 1 pack/day for 26.0 years (26.0 ttl pk-yrs)    Types: Cigarettes   Smokeless tobacco: Never  Vaping Use   Vaping status: Never Used  Substance and Sexual Activity   Alcohol use: No    Comment: not now   Drug use: No   Sexual activity: Yes    Birth control/protection: None  Other Topics Concern   Not on file  Social History Narrative   Not on file   Social Determinants of Health   Financial Resource Strain: Not on file  Food Insecurity: Not on file  Transportation Needs: Not on file  Physical Activity: Not on file  Stress: Not on file  Social Connections: Not on file    Review of Systems Per HPI  Objective:  BP (!) 151/88   Pulse 81   Temp 98.2 F (36.8 C)   Ht 5' 5.5" (1.664 m)   Wt 245 lb (111.1 kg)   SpO2 97%   BMI 40.15 kg/m      06/18/2023    9:35 AM 12/18/2022    9:21 AM 06/17/2022    9:15 AM  BP/Weight  Systolic BP 151 138 134  Diastolic BP 88 80 76  Wt. (Lbs) 245 241 240.4  BMI 40.15 kg/m2 39.49 kg/m2 39.4 kg/m2    Physical Exam Vitals and nursing note reviewed.  Constitutional:  Appearance: Normal appearance. She is obese.  HENT:     Head: Normocephalic and atraumatic.  Eyes:     General:        Right eye: No discharge.        Left eye: No discharge.     Conjunctiva/sclera: Conjunctivae normal.  Cardiovascular:     Rate and Rhythm: Normal rate and regular rhythm.  Pulmonary:     Effort: Pulmonary effort is normal.     Breath sounds: Normal breath sounds. No wheezing, rhonchi or rales.  Neurological:     General: No focal deficit present.     Mental Status: She is alert.     Lab Results  Component Value Date   WBC 7.3 03/05/2022   HGB 14.5 03/05/2022   HCT 42.7 03/05/2022   PLT 260 03/05/2022   GLUCOSE 147 (H) 06/17/2023   CHOL 228 (H) 06/17/2023   TRIG 190 (H)  06/17/2023   HDL 43 06/17/2023   LDLCALC 150 (H) 06/17/2023   ALT 15 06/17/2023   AST 16 06/17/2023   NA 138 06/17/2023   K 4.4 06/17/2023   CL 99 06/17/2023   CREATININE 0.63 06/17/2023   BUN 11 06/17/2023   CO2 25 06/17/2023   TSH 1.350 03/05/2022   HGBA1C 7.6 (H) 06/17/2023     Assessment & Plan:   Problem List Items Addressed This Visit       Endocrine   Type 2 diabetes mellitus without complications (HCC)    Uncontrolled.  Advised compliance with metformin.  Given obesity will start Mounjaro.      Relevant Medications   tirzepatide (MOUNJARO) 2.5 MG/0.5ML Pen     Other   Smoker   Palpitations - Primary    Referring to cardiology for Holter.      Relevant Orders   Ambulatory referral to Cardiology   Headache associated with sexual activity    Has history of migraine headache.  He has recently been having headaches associated with sexual activity.  Placing on propranolol for prophylaxis.      Relevant Medications   propranolol (INDERAL) 40 MG tablet    Meds ordered this encounter  Medications   tirzepatide (MOUNJARO) 2.5 MG/0.5ML Pen    Sig: Inject 2.5 mg into the skin once a week.    Dispense:  2 mL    Refill:  0   propranolol (INDERAL) 40 MG tablet    Sig: Take 1 tablet (40 mg total) by mouth daily at 6 (six) AM.    Dispense:  30 tablet    Refill:  1    Follow-up:  Return in about 3 months (around 09/18/2023).  Everlene Other DO [facility]

## 2023-07-15 ENCOUNTER — Other Ambulatory Visit: Payer: Self-pay | Admitting: Family Medicine

## 2023-07-16 ENCOUNTER — Other Ambulatory Visit: Payer: Self-pay | Admitting: Family Medicine

## 2023-07-16 MED ORDER — TIRZEPATIDE 5 MG/0.5ML ~~LOC~~ SOAJ: 5.0000 mg | SUBCUTANEOUS | 0 refills | Status: DC

## 2023-07-19 ENCOUNTER — Other Ambulatory Visit: Payer: Self-pay | Admitting: Family Medicine

## 2023-07-29 ENCOUNTER — Ambulatory Visit (INDEPENDENT_AMBULATORY_CARE_PROVIDER_SITE_OTHER): Payer: 59 | Admitting: *Deleted

## 2023-07-29 VITALS — BP 150/89 | HR 84 | Ht 66.0 in | Wt 244.0 lb

## 2023-07-29 DIAGNOSIS — Z3490 Encounter for supervision of normal pregnancy, unspecified, unspecified trimester: Secondary | ICD-10-CM

## 2023-07-29 DIAGNOSIS — N96 Recurrent pregnancy loss: Secondary | ICD-10-CM

## 2023-07-29 DIAGNOSIS — N926 Irregular menstruation, unspecified: Secondary | ICD-10-CM | POA: Diagnosis not present

## 2023-07-29 LAB — POCT URINE PREGNANCY: Preg Test, Ur: POSITIVE — AB

## 2023-07-29 NOTE — Progress Notes (Signed)
   NURSE VISIT- PREGNANCY CONFIRMATION   SUBJECTIVE:  Susan Reeves is a 47 y.o. [id number] female by certain LMP of Patient's last menstrual period was 07/01/2023 (exact date). Here for pregnancy confirmation.  States she has still been having a period monthly. Home pregnancy test: positive x 3   She reports nausea.  She is taking prenatal vitamins.    OBJECTIVE:  BP (!) 150/89 (BP Location: Right Arm, Patient Position: Sitting, Cuff Size: Large)   Pulse 84   Ht 5\' 6"  (1.676 m)   Wt 244 lb (110.7 kg)   LMP 07/01/2023 (Exact Date)   BMI 39.38 kg/m   Appears well, in no apparent distress  Results for orders placed or performed in visit on 07/29/23 (from the past 24 hour(s))  POCT urine pregnancy   Collection Time: 07/29/23  2:23 PM  Result Value Ref Range   Preg Test, Ur Positive (A) Negative    ASSESSMENT: Positive Pregnancy test, LMP 07/01/23     PLAN: HCG/Progesterone ordered due to hx of recurrent misarriages Schedule for dating ultrasound TBD based on HCG Prenatal vitamins: will continue   Nausea medicines: not currently needed   OB packet given: No  Jobe Marker  07/29/2023 2:23 PM

## 2023-07-30 ENCOUNTER — Other Ambulatory Visit: Payer: Self-pay | Admitting: Adult Health

## 2023-07-30 DIAGNOSIS — Z3490 Encounter for supervision of normal pregnancy, unspecified, unspecified trimester: Secondary | ICD-10-CM

## 2023-07-30 LAB — BETA HCG QUANT (REF LAB): hCG Quant: 22 m[IU]/mL

## 2023-07-30 LAB — PROGESTERONE: Progesterone: 10.7 ng/mL

## 2023-07-30 MED ORDER — PROGESTERONE 200 MG PO CAPS
ORAL_CAPSULE | ORAL | 3 refills | Status: DC
Start: 2023-07-30 — End: 2023-08-25

## 2023-07-30 NOTE — Progress Notes (Signed)
Rx sent in for Prometrium

## 2023-08-01 ENCOUNTER — Other Ambulatory Visit: Payer: Self-pay | Admitting: Adult Health

## 2023-08-01 LAB — BETA HCG QUANT (REF LAB): hCG Quant: 34 m[IU]/mL

## 2023-08-01 MED ORDER — METFORMIN HCL 500 MG PO TABS: 500.0000 mg | ORAL_TABLET | Freq: Two times a day (BID) | ORAL | 3 refills | Status: DC

## 2023-08-01 NOTE — Progress Notes (Signed)
Stop crestor and mounjaro and let PCP know will rx metformin

## 2023-08-05 ENCOUNTER — Other Ambulatory Visit: Payer: Self-pay | Admitting: Adult Health

## 2023-08-05 DIAGNOSIS — Z3490 Encounter for supervision of normal pregnancy, unspecified, unspecified trimester: Secondary | ICD-10-CM

## 2023-08-05 NOTE — Progress Notes (Signed)
Ck QHCG  

## 2023-08-06 ENCOUNTER — Other Ambulatory Visit: Payer: Self-pay | Admitting: Adult Health

## 2023-08-06 DIAGNOSIS — Z3490 Encounter for supervision of normal pregnancy, unspecified, unspecified trimester: Secondary | ICD-10-CM

## 2023-08-06 LAB — BETA HCG QUANT (REF LAB): hCG Quant: 45 m[IU]/mL

## 2023-08-06 NOTE — Progress Notes (Signed)
Ck QHCG  

## 2023-08-08 LAB — BETA HCG QUANT (REF LAB): hCG Quant: 37 m[IU]/mL

## 2023-08-11 ENCOUNTER — Other Ambulatory Visit: Payer: Self-pay

## 2023-08-11 ENCOUNTER — Other Ambulatory Visit: Payer: Self-pay | Admitting: Family Medicine

## 2023-08-11 ENCOUNTER — Encounter: Payer: Self-pay | Admitting: Family Medicine

## 2023-08-11 ENCOUNTER — Other Ambulatory Visit: Payer: Self-pay | Admitting: Adult Health

## 2023-08-11 DIAGNOSIS — Z3490 Encounter for supervision of normal pregnancy, unspecified, unspecified trimester: Secondary | ICD-10-CM

## 2023-08-11 MED ORDER — TIRZEPATIDE 5 MG/0.5ML ~~LOC~~ SOAJ: 5.0000 mg | SUBCUTANEOUS | 0 refills | Status: DC

## 2023-08-13 ENCOUNTER — Other Ambulatory Visit: Payer: Self-pay | Admitting: Family Medicine

## 2023-08-13 ENCOUNTER — Other Ambulatory Visit: Payer: Self-pay | Admitting: Adult Health

## 2023-08-13 DIAGNOSIS — O039 Complete or unspecified spontaneous abortion without complication: Secondary | ICD-10-CM

## 2023-08-13 LAB — BETA HCG QUANT (REF LAB): hCG Quant: 6 m[IU]/mL

## 2023-08-13 MED ORDER — TIRZEPATIDE 5 MG/0.5ML ~~LOC~~ SOAJ: 5.0000 mg | SUBCUTANEOUS | 0 refills | Status: DC

## 2023-08-19 ENCOUNTER — Ambulatory Visit: Payer: 59 | Admitting: Family Medicine

## 2023-08-25 ENCOUNTER — Ambulatory Visit: Payer: 59 | Admitting: Family Medicine

## 2023-08-25 VITALS — BP 116/81 | HR 71 | Temp 98.6°F | Ht 66.0 in | Wt 240.2 lb

## 2023-08-25 DIAGNOSIS — E119 Type 2 diabetes mellitus without complications: Secondary | ICD-10-CM | POA: Diagnosis not present

## 2023-08-25 DIAGNOSIS — I10 Essential (primary) hypertension: Secondary | ICD-10-CM | POA: Diagnosis not present

## 2023-08-25 DIAGNOSIS — R002 Palpitations: Secondary | ICD-10-CM | POA: Diagnosis not present

## 2023-08-25 DIAGNOSIS — E782 Mixed hyperlipidemia: Secondary | ICD-10-CM | POA: Diagnosis not present

## 2023-08-25 DIAGNOSIS — Z7985 Long-term (current) use of injectable non-insulin antidiabetic drugs: Secondary | ICD-10-CM

## 2023-08-25 MED ORDER — TIRZEPATIDE 5 MG/0.5ML ~~LOC~~ SOAJ: 5.0000 mg | SUBCUTANEOUS | 3 refills | Status: DC

## 2023-08-25 NOTE — Patient Instructions (Addendum)
Metformin discontinued.  Labs after 10/16.  Follow up in January.  Take care  Dr. Adriana Simas

## 2023-08-26 NOTE — Assessment & Plan Note (Signed)
Lipid panel to reassess.

## 2023-08-26 NOTE — Assessment & Plan Note (Signed)
Future A1c ordered.  Stopping metformin.  Continue Mounjaro.  Refilled today.

## 2023-08-26 NOTE — Assessment & Plan Note (Signed)
Stable on amlodipine.  Continue.

## 2023-08-26 NOTE — Progress Notes (Signed)
Subjective:  Patient ID: Susan Reeves, female    DOB: [birth year]  Age: 47 y.o. MRN: [id number]  CC: Follow up   HPI:  47 year old female with migraine, hypertension, type 2 diabetes, hyperlipidemia, tobacco abuse, history palpitations presents for follow-up.  Patient recently had a miscarriage.  Patient states that she is currently doing well.  We have restarted her Mounjaro.  She states that her blood sugars are fairly well-controlled.  Patient still has metformin on her list but she is not taking this medication.  Will discontinue today.  Hypertension stable on amlodipine.  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   Smoker 12/06/2015    Social Hx   Social History   Socioeconomic History   Marital status: Married    Spouse name: Not on file   Number of children: Not on file   Years of education: Not on file   Highest education level: Not on file  Occupational History   Not on file  Tobacco Use   Smoking status: Every Day    Current packs/day: 1.00    Average packs/day: 1 pack/day for 26.0 years (26.0 ttl pk-yrs)    Types: Cigarettes   Smokeless tobacco: Never  Vaping Use   Vaping status: Never Used  Substance and Sexual Activity   Alcohol use: No    Comment: not now   Drug use: No   Sexual activity: Yes    Birth control/protection: None  Other Topics Concern   Not on file  Social History Narrative   Not on file   Social Determinants of Health   Financial Resource Strain: Not on file  Food Insecurity: Not on file  Transportation Needs: Not on file  Physical Activity: Not on file  Stress: Not on file  Social Connections: Not on file     Review of Systems Per HPI  Objective:  BP 116/81   Pulse 71   Temp 98.6 F (37 C) (Oral)   Ht 5\' 6"  (1.676 m)   Wt 240 lb 3.2 oz (109 kg)   LMP 07/01/2023 (Exact Date)   SpO2 97%   BMI 38.77 kg/m      08/25/2023   10:16 AM 07/29/2023    2:02 PM 06/18/2023    9:35 AM  BP/Weight  Systolic BP 116 150 151  Diastolic BP 81 89 88  Wt. (Lbs) 240.2 244 245  BMI 38.77 kg/m2 39.38 kg/m2 40.15 kg/m2    Physical Exam Vitals reviewed.  Constitutional:      Appearance: Normal appearance. She is obese.  HENT:     Head: Normocephalic and atraumatic.  Cardiovascular:     Rate and Rhythm: Normal rate and regular rhythm.  Pulmonary:     Effort: Pulmonary effort is normal.     Breath sounds: Normal breath sounds. No wheezing, rhonchi or rales.  Neurological:     Mental Status: She is alert.  Psychiatric:        Mood and Affect: Mood normal.        Behavior: Behavior normal.     Lab Results  Component Value Date   WBC 7.3 03/05/2022   HGB 14.5 03/05/2022   HCT 42.7 03/05/2022   PLT 260 03/05/2022  GLUCOSE 147 (H) 06/17/2023   CHOL 228 (H) 06/17/2023   TRIG 190 (H) 06/17/2023   HDL 43 06/17/2023   LDLCALC 150 (H) 06/17/2023   ALT 15 06/17/2023   AST 16 06/17/2023   NA 138 06/17/2023   K 4.4 06/17/2023   CL 99 06/17/2023   CREATININE 0.63 06/17/2023   BUN 11 06/17/2023   CO2 25 06/17/2023   TSH 1.350 03/05/2022   HGBA1C 7.6 (H) 06/17/2023     Assessment & Plan:   Problem List Items Addressed This Visit       Cardiovascular and Mediastinum   Hypertension    Stable on amlodipine.  Continue.        Endocrine   Type 2 diabetes mellitus without complications (HCC) - Primary    Future A1c ordered.  Stopping metformin.  Continue Mounjaro.  Refilled today.      Relevant Medications   tirzepatide (MOUNJARO) 5 MG/0.5ML Pen   Other Relevant Orders   Hemoglobin A1c     Other   Palpitations   Relevant Orders   TSH   Mixed hyperlipidemia    Lipid panel  to reassess.      Relevant Orders   Lipid panel    Meds ordered this encounter  Medications   tirzepatide (MOUNJARO) 5 MG/0.5ML Pen    Sig: Inject 5 mg into the skin once a week.    Dispense:  6 mL    Refill:  3    Follow-up: Follow-up in January.  Everlene Other DO [facility]

## 2023-09-19 LAB — LIPID PANEL
Chol/HDL Ratio: 4.1 {ratio} (ref 0.0–4.4)
Cholesterol, Total: 160 mg/dL (ref 100–199)
HDL: 39 mg/dL — ABNORMAL LOW (ref >39)
LDL Chol Calc (NIH): 102 mg/dL — ABNORMAL HIGH (ref 0–99)
Triglycerides: 103 mg/dL (ref 0–149)
VLDL Cholesterol Cal: 19 mg/dL (ref 5–40)

## 2023-09-19 LAB — HEMOGLOBIN A1C
Est. average glucose Bld gHb Est-mCnc: 137 mg/dL
Hgb A1c MFr Bld: 6.4 % — ABNORMAL HIGH (ref 4.8–5.6)

## 2023-09-19 LAB — TSH: TSH: 1.32 u[IU]/mL (ref 0.450–4.500)

## 2023-10-02 ENCOUNTER — Other Ambulatory Visit: Payer: Self-pay | Admitting: Family Medicine

## 2023-10-29 ENCOUNTER — Other Ambulatory Visit: Payer: Self-pay | Admitting: Family Medicine

## 2023-11-08 ENCOUNTER — Emergency Department (HOSPITAL_BASED_OUTPATIENT_CLINIC_OR_DEPARTMENT_OTHER)
Admission: EM | Admit: 2023-11-08 | Discharge: 2023-11-08 | Disposition: A | Discharge status: Home / Self Care | Payer: 59 | Attending: Emergency Medicine | Admitting: Emergency Medicine

## 2023-11-08 ENCOUNTER — Other Ambulatory Visit: Payer: Self-pay

## 2023-11-08 ENCOUNTER — Emergency Department (HOSPITAL_BASED_OUTPATIENT_CLINIC_OR_DEPARTMENT_OTHER): Payer: 59

## 2023-11-08 DIAGNOSIS — J189 Pneumonia, unspecified organism: Secondary | ICD-10-CM | POA: Insufficient documentation

## 2023-11-08 DIAGNOSIS — Z20822 Contact with and (suspected) exposure to covid-19: Secondary | ICD-10-CM | POA: Diagnosis not present

## 2023-11-08 DIAGNOSIS — F17200 Nicotine dependence, unspecified, uncomplicated: Secondary | ICD-10-CM | POA: Diagnosis not present

## 2023-11-08 DIAGNOSIS — R0602 Shortness of breath: Secondary | ICD-10-CM | POA: Diagnosis present

## 2023-11-08 LAB — CBC WITH DIFFERENTIAL/PLATELET
Abs Immature Granulocytes: 0.03 10*3/uL (ref 0.00–0.07)
Basophils Absolute: 0 10*3/uL (ref 0.0–0.1)
Basophils Relative: 0 %
Eosinophils Absolute: 0 10*3/uL (ref 0.0–0.5)
Eosinophils Relative: 0 %
HCT: 39.8 % (ref 36.0–46.0)
Hemoglobin: 13.5 g/dL (ref 12.0–15.0)
Immature Granulocytes: 0 %
Lymphocytes Relative: 16 %
Lymphs Abs: 1.4 10*3/uL (ref 0.7–4.0)
MCH: 31.2 pg (ref 26.0–34.0)
MCHC: 33.9 g/dL (ref 30.0–36.0)
MCV: 91.9 fL (ref 80.0–100.0)
Monocytes Absolute: 0.7 10*3/uL (ref 0.1–1.0)
Monocytes Relative: 8 %
Neutro Abs: 6.7 10*3/uL (ref 1.7–7.7)
Neutrophils Relative %: 76 %
Platelets: 199 10*3/uL (ref 150–400)
RBC: 4.33 MIL/uL (ref 3.87–5.11)
RDW: 13.2 % (ref 11.5–15.5)
WBC: 8.7 10*3/uL (ref 4.0–10.5)
nRBC: 0 % (ref 0.0–0.2)

## 2023-11-08 LAB — RESP PANEL BY RT-PCR (RSV, FLU A&B, COVID)  RVPGX2
Influenza A by PCR: NEGATIVE
Influenza B by PCR: NEGATIVE
Resp Syncytial Virus by PCR: NEGATIVE
SARS Coronavirus 2 by RT PCR: NEGATIVE

## 2023-11-08 LAB — BASIC METABOLIC PANEL
Anion gap: 8 (ref 5–15)
BUN: 13 mg/dL (ref 6–20)
CO2: 29 mmol/L (ref 22–32)
Calcium: 8.9 mg/dL (ref 8.9–10.3)
Chloride: 97 mmol/L — ABNORMAL LOW (ref 98–111)
Creatinine, Ser: 0.77 mg/dL (ref 0.44–1.00)
GFR, Estimated: >60 mL/min (ref >60)
Glucose, Bld: 145 mg/dL — ABNORMAL HIGH (ref 70–99)
Potassium: 3.4 mmol/L — ABNORMAL LOW (ref 3.5–5.1)
Sodium: 134 mmol/L — ABNORMAL LOW (ref 135–145)

## 2023-11-08 MED ORDER — PREDNISONE 20 MG PO TABS: 40.0000 mg | ORAL_TABLET | Freq: Every day | ORAL | 0 refills | Status: AC

## 2023-11-08 MED ORDER — DOXYCYCLINE HYCLATE 100 MG PO CAPS: 100.0000 mg | ORAL_CAPSULE | Freq: Two times a day (BID) | ORAL | 0 refills | Status: DC

## 2023-11-08 MED ORDER — ALBUTEROL SULFATE HFA 108 (90 BASE) MCG/ACT IN AERS
4.0000 | INHALATION_SPRAY | Freq: Once | RESPIRATORY_TRACT | Status: AC
  Administered 2023-11-08: 4 via RESPIRATORY_TRACT
  Filled 2023-11-08: qty 6.7

## 2023-11-08 MED ORDER — ACETAMINOPHEN 500 MG PO TABS
1000.0000 mg | ORAL_TABLET | Freq: Once | ORAL | Status: AC
  Administered 2023-11-08: 1000 mg via ORAL
  Filled 2023-11-08: qty 2

## 2023-11-08 MED ORDER — DOXYCYCLINE HYCLATE 100 MG PO TABS
100.0000 mg | ORAL_TABLET | Freq: Once | ORAL | Status: AC
  Administered 2023-11-08: 100 mg via ORAL
  Filled 2023-11-08: qty 1

## 2023-11-08 MED ORDER — METHYLPREDNISOLONE SODIUM SUCC 125 MG IJ SOLR
125.0000 mg | Freq: Once | INTRAMUSCULAR | Status: AC
  Administered 2023-11-08: 125 mg via INTRAVENOUS
  Filled 2023-11-08: qty 2

## 2023-11-08 MED ORDER — SODIUM CHLORIDE 0.9 % IV BOLUS
1000.0000 mL | Freq: Once | INTRAVENOUS | Status: AC
  Administered 2023-11-08: 1000 mL via INTRAVENOUS

## 2023-11-08 MED ORDER — AEROCHAMBER PLUS FLO-VU MISC
1.0000 | Freq: Once | Status: AC
Start: 2023-11-08 — End: 2023-11-08
  Administered 2023-11-08: 1
  Filled 2023-11-08: qty 1

## 2023-11-08 MED ORDER — IBUPROFEN 800 MG PO TABS
800.0000 mg | ORAL_TABLET | Freq: Once | ORAL | Status: AC
  Administered 2023-11-08: 800 mg via ORAL
  Filled 2023-11-08: qty 1

## 2023-11-08 NOTE — Discharge Instructions (Addendum)
Take next dose of antibiotic and steroid tomorrow.  Follow-up with your primary care doctor to ensure resolution of your pneumonia.  Please return if symptoms worsen as we discussed.

## 2023-11-08 NOTE — ED Notes (Signed)
RT educated pt on proper use of MDI w/spacer. Pt able to perform w/out difficulty. Pt respiratory status stable on RA w/no distress noted, pt is a smoker and has some increased WOB at this time. Pt given treatment w/spacer. Pt also educated on smoking cessation and the importance of abstaining from smoking to improve her breathing. Pt verbalizes understanding teaching.    11/08/23 1932  Aerosol Therapy Tx  $ Hand Held Nebulizer  1  Medications Albuterol  Delivery Device MDI (w/spacer)  Pre-Treatment Pulse 114  Pre-Treatment Respirations 28  Treatment Tolerance Tolerated well  Treatment Given 1  MEWS Score/Color  MEWS Score 7  MEWS Score Color Red  RT Breath Sounds  Bilateral Breath Sounds Clear;Diminished  R Upper  Breath Sounds Clear  L Upper Breath Sounds Clear  R Lower Breath Sounds Diminished  L Lower Breath Sounds Diminished  Oxygen Therapy/Pulse Ox  O2 Device Room Air  O2 Therapy Room air

## 2023-11-08 NOTE — ED Provider Notes (Signed)
[place] EMERGENCY DEPARTMENT AT [facility] Provider Note   CSN: [id number] Arrival date & time: 11/08/23  1800     History  Chief Complaint  Patient presents with   Cough   Shortness of Breath    Susan Reeves is a 47 y.o. female.  Patient here with cough and congestion for the last several days.  Noticed a fever today.  Had exposure to someone who was sick as well.  She has not taken anything for fever.  History of smoking but denies any history of asthma or COPD.  She has a history of hypertension high cholesterol.  She denies any chest pain weakness numbness tingling.  She has had dry cough.Patient.  The history is provided by the patient.       Home Medications Prior to Admission medications   Medication Sig Start Date End Date Taking? Authorizing Provider  doxycycline (VIBRAMYCIN) 100 MG capsule Take 1 capsule (100 mg total) by mouth 2 (two) times daily. 11/08/23  Yes Delpha Perko, DO  predniSONE (DELTASONE) 20 MG tablet Take 2 tablets (40 mg total) by mouth daily for 4 days. 11/08/23 11/12/23 Yes Telia Amundson, DO  Accu-Chek Softclix Lancets lancets Use to check blood sugars 4 times daily. DX:E11.9 06/17/22   Tommie Sams, DO  amLODipine (NORVASC) 5 MG tablet TAKE 1 TABLET(5 MG) BY MOUTH DAILY 10/02/23   Everlene Other G, DO  blood glucose meter kit and supplies KIT Dispense based on patient and insurance preference. Use up to four times daily as directed. 06/17/22   Tommie Sams, DO  cyanocobalamin (VITAMIN B12) 500 MCG tablet Take 500 mcg by mouth daily.    [provider]  Glucose Blood (BLOOD GLUCOSE TEST STRIPS 333) STRP Use to check blood sugars 4 times daily. Dx.E11.9 06/17/22   Tommie Sams, DO  Multiple Vitamin (MULTIVITAMIN ADULT PO) Take by mouth.    [provider]  propranolol (INDERAL) 40 MG tablet TAKE 1 TABLET(40 MG) BY MOUTH DAILY AT 6 AM 10/29/23   Cook, Dorie Rank G, DO  rosuvastatin (CRESTOR) 20 MG tablet TAKE 1 TABLET(20 MG) BY  MOUTH DAILY 10/02/23   Cook, [place] G, DO  tirzepatide [facility]) 5 MG/0.5ML Pen Inject 5 mg into the skin once a week. 08/25/23   Tommie Sams, DO      Allergies    Labetalol and Other    Review of Systems   Review of Systems  Physical Exam Updated Vital Signs BP (!) 121/57   Pulse (!) 113   Temp (!) 103 F (39.4 C)   Resp (!) 39   Ht 5\' 5"  (1.651 m)   Wt 107 kg   LMP 07/10/2023 Comment: perimenopausal  SpO2 94%   BMI 39.27 kg/m  Physical Exam Vitals and nursing note reviewed.  Constitutional:      General: She is not in acute distress.    Appearance: She is well-developed. She is not ill-appearing.  HENT:     Head: Normocephalic and atraumatic.  Eyes:     Extraocular Movements: Extraocular movements intact.     Conjunctiva/sclera: Conjunctivae normal.     Pupils: Pupils are equal, round, and reactive to light.  Cardiovascular:     Rate and Rhythm: Normal rate and regular rhythm.     Pulses: Normal pulses.     Heart sounds: Normal heart sounds. No murmur heard. Pulmonary:     Effort: Tachypnea present. No respiratory distress.     Breath sounds: Normal breath sounds. No  decreased breath sounds or wheezing.  Abdominal:     Palpations: Abdomen is soft.     Tenderness: There is no abdominal tenderness.  Musculoskeletal:        General: No swelling. Normal range of motion.     Cervical back: Normal range of motion and neck supple.     Right lower leg: No edema.     Left lower leg: No edema.  Skin:    General: Skin is warm and dry.     Capillary Refill: Capillary refill takes less than 2 seconds.  Neurological:     General: No focal deficit present.     Mental Status: She is alert.  Psychiatric:        Mood and Affect: Mood normal.     ED Results / Procedures / Treatments   Labs (all labs ordered are listed, but only abnormal results are displayed) Labs Reviewed  BASIC METABOLIC PANEL - Abnormal; Notable for the following components:      Result Value    Sodium 134 (*)    Potassium 3.4 (*)    Chloride 97 (*)    Glucose, Bld 145 (*)    All other components within normal limits  RESP PANEL BY RT-PCR (RSV, FLU A&B, COVID)  [id number]  CBC WITH DIFFERENTIAL/PLATELET    EKG EKG Interpretation Date/Time:  Saturday November 08 2023 18:48:27 EST Ventricular Rate:  120 PR Interval:  117 QRS Duration:  91 QT Interval:  304 QTC Calculation: 430 R Axis:   104  Text Interpretation: Sinus tachycardia Right axis deviation Confirmed by Virgina Norfolk [phone]) on 11/08/2023 6:58:32 PM  Radiology DG Chest Portable 1 View  Result Date: 11/08/2023 CLINICAL DATA:  Cough, congestion, fever EXAM: PORTABLE CHEST 1 VIEW COMPARISON:  None Available. FINDINGS: Mild right lower lobe opacity, suspicious for pneumonia. Left lung is essentially clear. No pleural effusion or pneumothorax. The heart is normal in size. IMPRESSION: Mild right lower lobe opacity, suspicious for pneumonia. Electronically Signed   By: Charline Bills M.D.   On: 11/08/2023 19:15    Procedures Procedures    Medications Ordered in ED Medications  methylPREDNISolone sodium succinate (SOLU-MEDROL) 125 mg/2 mL injection 125 mg (has no administration in time range)  ibuprofen (ADVIL) tablet 800 mg (has no administration in time range)  doxycycline (VIBRA-TABS) tablet 100 mg (has no administration in time range)  acetaminophen (TYLENOL) tablet 1,000 mg (1,000 mg Oral Given 11/08/23 1906)  albuterol (VENTOLIN HFA) 108 (90 Base) MCG/ACT inhaler 4 puff (4 puffs Inhalation Given 11/08/23 1931)  sodium chloride 0.9 % bolus 1,000 mL (1,000 mLs Intravenous New Bag/Given 11/08/23 1919)  Aerochamber Plus device 1 each (1 each Other Given 11/08/23 2003)    ED Course/ Medical Decision Making/ A&P                                 Medical Decision Making Amount and/or Complexity of Data Reviewed Labs: ordered. Radiology: ordered.  Risk OTC drugs. Prescription drug management.   Susan Reeves is  here cough shortness of breath fever.  History of smoking.  Patient arrives febrile and tachycardic but fairly clear breath sounds on exam.  Little bit increased work of breathing.  She has had viral symptoms last few days no fever today.  She had a sick contact as well.  Differential diagnosis likely pneumonia versus bronchitis.  She is not on any immunosuppressants.  She is very well-appearing.  Her  blood pressure is excellent.  I think tachycardia is likely from the fever.  Will hold on full sepsis workup at this time as this could be a viral process.  She is dynamically stable.  Will give her breathing treatment and reevaluate.  Will get CBC BMP chest x-ray COVID and flu test.  Will give fluid bolus and Tylenol.  Patient feeling much better after Tylenol and breathing treatment.  Temperature now down to 100.5.  Heart rate now in the 90s.  Oxygen has been stable.  When I ambulated her in the room her oxygen levels are 94 to 95%.  She has not had any significant hypoxic events while in the ED.  Her work of breathing is improved.  Her lab work showed no significant leukocytosis or anemia or electrolyte abnormality.  COVID and flu test negative.  Chest x-ray however does show pneumonia.  Clinic please this fits.  I suspect that this is also from reactive airway process given her significant smoking history.  Will treat with steroids as well.  Overall shared decision was made for outpatient treatment with steroids and antibiotics.  Patient discharged in good condition.  She understands return precautions.  This chart was dictated using voice recognition software.  Despite best efforts to proofread,  errors can occur which can change the documentation meaning.          Final Clinical Impression(s) / ED Diagnoses Final diagnoses:  Community acquired pneumonia, unspecified laterality    Rx / DC Orders ED Discharge Orders          Ordered    doxycycline (VIBRAMYCIN) 100 MG capsule  2 times daily         11/08/23 2022    predniSONE (DELTASONE) 20 MG tablet  Daily        11/08/23 2022              Virgina Norfolk, DO 11/08/23 2036

## 2023-11-08 NOTE — ED Notes (Signed)
RT assessed the Pt. The Pt's lungs were clear. The Pt visually looked like she felt bad. Temp 103

## 2023-11-08 NOTE — ED Triage Notes (Signed)
Pt via pov from home with cough, congestion, fever x 4 days. Pt states her ear began to hurt today. Pt alert & oriented, obviously uncomfortable. NAD noted.

## 2023-11-13 ENCOUNTER — Ambulatory Visit: Payer: 59 | Admitting: Family Medicine

## 2023-11-13 VITALS — BP 126/84 | HR 77 | Temp 97.4°F | Ht 65.0 in | Wt 234.8 lb

## 2023-11-13 DIAGNOSIS — J189 Pneumonia, unspecified organism: Secondary | ICD-10-CM | POA: Diagnosis not present

## 2023-11-13 MED ORDER — AMOXICILLIN-POT CLAVULANATE 875-125 MG PO TABS: 1.0000 | ORAL_TABLET | Freq: Two times a day (BID) | ORAL | 0 refills | Status: DC

## 2023-11-13 MED ORDER — HYDROCODONE BIT-HOMATROP MBR 5-1.5 MG/5ML PO SOLN: 5.0000 mL | Freq: Three times a day (TID) | ORAL | 0 refills | Status: DC | PRN

## 2023-11-13 MED ORDER — BENZONATATE 200 MG PO CAPS: 200.0000 mg | ORAL_CAPSULE | Freq: Three times a day (TID) | ORAL | 0 refills | Status: DC | PRN

## 2023-11-13 NOTE — Assessment & Plan Note (Signed)
Given comorbidities, patient needs dual antibiotic therapy.  Placing on Augmentin.  Patient will finish doxycycline.  Tessalon Perles and Hycodan for cough.

## 2023-11-13 NOTE — Progress Notes (Signed)
Subjective:  Patient ID: Susan Reeves, female    DOB: [birth year]  Age: 47 y.o. MRN: [id number]  CC: Pneumonia   HPI:  47 year old female presents for follow-up regarding the above.  Recently seen in the ER on 12/7.  Diagnosed with community-acquired pneumonia.  Was treated with monotherapy with doxycycline.  She reports continued cough.  Cough is troublesome and causes associated chest wall discomfort.  She has had some difficulty sleeping as a result.  She has improved slightly but is still overall not feeling well.  No fever.  She is using albuterol regularly for shortness of breath and cough.  She has been using over-the-counter cough medication as well.  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   Smoker 12/06/2015    Social Hx   Social History   Socioeconomic History   Marital status: Married    Spouse name: Not on file   Number of children: Not on file   Years of education: Not on file   Highest education level: Not on file  Occupational History   Not on file  Tobacco Use   Smoking status: Every Day    Current packs/day: 1.00    Average packs/day: 1 pack/day for 26.0 years (26.0 ttl pk-yrs)    Types: Cigarettes   Smokeless tobacco: Never  Vaping Use   Vaping status: Never Used  Substance and Sexual Activity   Alcohol use: No    Comment: not now   Drug use: No   Sexual activity: Yes    Birth control/protection: None  Other Topics Concern   Not on file  Social History Narrative   Not on file   Social Drivers of Health   Financial Resource Strain: Not on file  Food Insecurity: Not on file   Transportation Needs: Not on file  Physical Activity: Not on file  Stress: Not on file  Social Connections: Not on file    Review of Systems Per HPI  Objective:  BP 126/84   Pulse 77   Temp (!) 97.4 F (36.3 C)   Ht 5\' 5"  (1.651 m)   Wt 234 lb 12.8 oz (106.5 kg)   LMP 07/10/2023 Comment: perimenopausal  SpO2 95%   BMI 39.07 kg/m      11/13/2023   10:19 AM 11/13/2023    9:50 AM 11/08/2023    9:00 PM  BP/Weight  Systolic BP 126 136 118  Diastolic BP 84 94 54  Wt. (Lbs)  234.8   BMI  39.07 kg/m2     Physical Exam Vitals and nursing note reviewed.  Constitutional:      General: She is not in acute distress.    Appearance: She is obese.  HENT:     Head: Normocephalic and atraumatic.  Cardiovascular:     Rate and Rhythm: Normal rate and regular rhythm.  Pulmonary:     Comments: Effort normal.  Coarse breath sounds. Neurological:     Mental Status: She is alert.     Lab Results  Component Value Date   WBC 8.7 11/08/2023   HGB 13.5 11/08/2023   HCT 39.8 11/08/2023   PLT 199 11/08/2023   GLUCOSE 145 (H) 11/08/2023  CHOL 160 09/18/2023   TRIG 103 09/18/2023   HDL 39 (L) 09/18/2023   LDLCALC 102 (H) 09/18/2023   ALT 15 06/17/2023   AST 16 06/17/2023   NA 134 (L) 11/08/2023   K 3.4 (L) 11/08/2023   CL 97 (L) 11/08/2023   CREATININE 0.77 11/08/2023   BUN 13 11/08/2023   CO2 29 11/08/2023   TSH 1.320 09/18/2023   HGBA1C 6.4 (H) 09/18/2023     Assessment & Plan:   Problem List Items Addressed This Visit       Respiratory   Community acquired pneumonia - Primary   Given comorbidities, patient needs dual antibiotic therapy.  Placing on Augmentin.  Patient will finish doxycycline.  Tessalon Perles and Hycodan for cough.      Relevant Medications   amoxicillin-clavulanate (AUGMENTIN) 875-125 MG tablet   HYDROcodone bit-homatropine (HYCODAN) 5-1.5 MG/5ML syrup   benzonatate (TESSALON) 200 MG capsule    Meds ordered this encounter  Medications    amoxicillin-clavulanate (AUGMENTIN) 875-125 MG tablet    Sig: Take 1 tablet by mouth 2 (two) times daily.    Dispense:  20 tablet    Refill:  0   HYDROcodone bit-homatropine (HYCODAN) 5-1.5 MG/5ML syrup    Sig: Take 5 mLs by mouth every 8 (eight) hours as needed for cough.    Dispense:  120 mL    Refill:  0   benzonatate (TESSALON) 200 MG capsule    Sig: Take 1 capsule (200 mg total) by mouth 3 (three) times daily as needed for cough.    Dispense:  30 capsule    Refill:  0    Follow-up:  Return if symptoms worsen or fail to improve.  Everlene Other DO [facility]

## 2023-11-13 NOTE — Patient Instructions (Signed)
Medications as prescribed.  If you worsen or fail to improve, please let me know.  Merry Christmas.

## 2023-12-25 ENCOUNTER — Ambulatory Visit: Payer: 59 | Admitting: Family Medicine

## 2023-12-25 VITALS — BP 142/85 | HR 80 | Temp 98.1°F | Ht 65.0 in | Wt 231.0 lb

## 2023-12-25 DIAGNOSIS — I10 Essential (primary) hypertension: Secondary | ICD-10-CM | POA: Diagnosis not present

## 2023-12-25 DIAGNOSIS — R002 Palpitations: Secondary | ICD-10-CM | POA: Diagnosis not present

## 2023-12-25 DIAGNOSIS — E1169 Type 2 diabetes mellitus with other specified complication: Secondary | ICD-10-CM | POA: Diagnosis not present

## 2023-12-25 DIAGNOSIS — F17200 Nicotine dependence, unspecified, uncomplicated: Secondary | ICD-10-CM

## 2023-12-25 DIAGNOSIS — E782 Mixed hyperlipidemia: Secondary | ICD-10-CM

## 2023-12-25 DIAGNOSIS — E119 Type 2 diabetes mellitus without complications: Secondary | ICD-10-CM

## 2023-12-25 MED ORDER — PROPRANOLOL HCL 40 MG PO TABS: 40.0000 mg | ORAL_TABLET | Freq: Every day | ORAL | 3 refills | Status: DC

## 2023-12-25 MED ORDER — BUPROPION HCL ER (SR) 150 MG PO TB12: ORAL_TABLET | ORAL | 3 refills | Status: DC

## 2023-12-25 NOTE — Progress Notes (Signed)
Subjective:  Patient ID: Susan Reeves, female    DOB: 01-Jul-1976  Age: 48 y.o. MRN: [id number]  CC:  Follow up   HPI:  48 year old female presents for follow-up.  Patient continues to smoke.  Will discuss smoking cessation options today.  BP mildly elevated here today.  Patient states that she has forgotten to take her medication over the past 2 days.  Patient's type 2 diabetes has been stable.  She is on Mounjaro and tolerating well.  Needs labs today.  Patient states that her last eye exam was in September.  She declines pneumococcal vaccine today.  Needs foot exam today.  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   Smoker 12/06/2015    Social Hx   Social History   Socioeconomic History   Marital status: Married    Spouse name: Not on file   Number of children: Not on file   Years of education: Not on file   Highest education level: Not on file  Occupational History   Not on file  Tobacco Use   Smoking status: Every Day    Current packs/day: 1.00    Average packs/day: 1 pack/day for 26.0 years (26.0 ttl pk-yrs)    Types: Cigarettes   Smokeless tobacco: Never  Vaping Use   Vaping status: Never Used  Substance and Sexual Activity   Alcohol use: No    Comment: not now   Drug use: No   Sexual activity: Yes    Birth control/protection: None  Other Topics Concern   Not on file  Social History Narrative   Not on file   Social Drivers of Health   Financial Resource Strain: Not on file  Food Insecurity: Not on file  Transportation Needs: Not on file  Physical Activity: Not on file  Stress: Not on file  Social Connections: Not on file     Review of Systems  Constitutional: Negative.   Cardiovascular: Negative.    Objective:  BP (!) 142/85   Pulse 80   Temp 98.1 F (36.7 C)   Ht 5\' 5"  (1.651 m)   Wt 231 lb (104.8 kg)   SpO2 97%   BMI 38.44 kg/m      12/25/2023    8:49 AM 12/25/2023    8:29 AM 11/13/2023   10:19 AM  BP/Weight  Systolic BP 142 160 126  Diastolic BP 85 86 84  Wt. (Lbs)  231   BMI  38.44 kg/m2     Physical Exam Vitals and nursing note reviewed.  Constitutional:      General: She is not in acute distress.    Appearance: Normal appearance.  HENT:     Head: Normocephalic and atraumatic.  Eyes:     General:        Right eye: No discharge.        Left eye: No discharge.     Conjunctiva/sclera: Conjunctivae normal.  Cardiovascular:     Rate and Rhythm: Normal rate and regular rhythm.  Pulmonary:     Effort: Pulmonary effort is normal.     Breath sounds: Normal breath sounds. No wheezing, rhonchi or rales.  Feet:     Comments: Diabetic Foot Check -  Appearance -  no lesions, ulcers or calluses Skin - no unusual pallor or redness Monofilament testing -  Right - Great toe, medial, central, lateral ball and posterior foot intact Left - Great toe, medial, central, lateral ball and posterior foot intact  Neurological:     Mental Status: She is alert.  Psychiatric:        Mood and Affect: Mood normal.        Behavior: Behavior normal.     Lab Results  Component Value Date   WBC 8.7 11/08/2023   HGB 13.5 11/08/2023   HCT 39.8 11/08/2023   PLT 199 11/08/2023   GLUCOSE 145 (H) 11/08/2023   CHOL 160 09/18/2023   TRIG 103 09/18/2023   HDL 39 (L) 09/18/2023   LDLCALC 102 (H) 09/18/2023   ALT 15 06/17/2023   AST 16 06/17/2023   NA 134 (L) 11/08/2023   K 3.4 (L) 11/08/2023   CL 97 (L) 11/08/2023   CREATININE 0.77 11/08/2023   BUN 13 11/08/2023   CO2 29 11/08/2023   TSH 1.320 09/18/2023   HGBA1C 6.4 (H) 09/18/2023     Assessment & Plan:   Problem List Items Addressed This  Visit       Cardiovascular and Mediastinum   Hypertension   Advised compliance with medication.  Blood pressure improved on repeat.      Relevant Medications   propranolol (INDERAL) 40 MG tablet     Endocrine   Type 2 diabetes mellitus without complications (HCC) - Primary   Stable.  Labs today.  Continue Mounjaro.      Relevant Orders   Microalbumin / creatinine urine ratio   Hemoglobin A1c   CMP14+EGFR     Other   Smoker   Starting Wellbutrin.      Palpitations   Propranolol refilled.      Mixed hyperlipidemia   Lipid panel today.  Continue Crestor.      Relevant Medications   propranolol (INDERAL) 40 MG tablet   Other Relevant Orders   Lipid panel    Meds ordered this encounter  Medications   propranolol (INDERAL) 40 MG tablet    Sig: Take 1 tablet (40 mg total) by mouth daily.    Dispense:  90 tablet    Refill:  3   buPROPion (WELLBUTRIN SR) 150 MG 12 hr tablet    Sig: 150 mg once daily for 3 days then increase to 150 mg twice daily    Dispense:  180 tablet    Refill:  3    Follow-up:  Return in about 6 months (around 06/23/2024).  Everlene Other DO [facility]

## 2023-12-25 NOTE — Assessment & Plan Note (Signed)
Advised compliance with medication.  Blood pressure improved on repeat.

## 2023-12-25 NOTE — Assessment & Plan Note (Signed)
Stable.  Labs today.  Continue Mounjaro.

## 2023-12-25 NOTE — Assessment & Plan Note (Signed)
Propranolol refilled.

## 2023-12-25 NOTE — Assessment & Plan Note (Signed)
Lipid panel today.  Continue Crestor. 

## 2023-12-25 NOTE — Assessment & Plan Note (Signed)
 Starting Wellbutrin.

## 2023-12-25 NOTE — Patient Instructions (Signed)
Medication as directed.  Labs today.  Follow up in 6 months.

## 2023-12-26 LAB — CMP14+EGFR
ALT: 16 [IU]/L (ref 0–32)
AST: 16 [IU]/L (ref 0–40)
Albumin: 4.6 g/dL (ref 3.9–4.9)
Alkaline Phosphatase: 87 [IU]/L (ref 44–121)
BUN/Creatinine Ratio: 23 (ref 9–23)
BUN: 16 mg/dL (ref 6–24)
Bilirubin Total: 0.4 mg/dL (ref 0.0–1.2)
CO2: 24 mmol/L (ref 20–29)
Calcium: 9.8 mg/dL (ref 8.7–10.2)
Chloride: 103 mmol/L (ref 96–106)
Creatinine, Ser: 0.71 mg/dL (ref 0.57–1.00)
Globulin, Total: 2.5 g/dL (ref 1.5–4.5)
Glucose: 118 mg/dL — ABNORMAL HIGH (ref 70–99)
Potassium: 4.2 mmol/L (ref 3.5–5.2)
Sodium: 142 mmol/L (ref 134–144)
Total Protein: 7.1 g/dL (ref 6.0–8.5)
eGFR: 105 mL/min/{1.73_m2} (ref >59)

## 2023-12-26 LAB — MICROALBUMIN / CREATININE URINE RATIO
Creatinine, Urine: 142.2 mg/dL
Microalb/Creat Ratio: 5 mg/g{creat} (ref 0–29)
Microalbumin, Urine: 7.4 ug/mL

## 2023-12-26 LAB — HEMOGLOBIN A1C
Est. average glucose Bld gHb Est-mCnc: 137 mg/dL
Hgb A1c MFr Bld: 6.4 % — ABNORMAL HIGH (ref 4.8–5.6)

## 2023-12-26 LAB — LIPID PANEL
Chol/HDL Ratio: 3.8 {ratio} (ref 0.0–4.4)
Cholesterol, Total: 179 mg/dL (ref 100–199)
HDL: 47 mg/dL (ref >39)
LDL Chol Calc (NIH): 116 mg/dL — ABNORMAL HIGH (ref 0–99)
Triglycerides: 84 mg/dL (ref 0–149)
VLDL Cholesterol Cal: 16 mg/dL (ref 5–40)

## 2023-12-28 ENCOUNTER — Encounter: Payer: Self-pay | Admitting: Family Medicine

## 2024-01-31 ENCOUNTER — Other Ambulatory Visit: Payer: Self-pay | Admitting: Family Medicine

## 2024-05-14 ENCOUNTER — Other Ambulatory Visit: Payer: Self-pay | Admitting: Family Medicine

## 2024-05-17 ENCOUNTER — Other Ambulatory Visit: Payer: Self-pay

## 2024-05-17 MED ORDER — AMLODIPINE BESYLATE 5 MG PO TABS: 5.0000 mg | ORAL_TABLET | Freq: Every day | ORAL | 0 refills | Status: DC

## 2024-06-13 ENCOUNTER — Encounter (HOSPITAL_COMMUNITY): Payer: Self-pay | Admitting: Emergency Medicine

## 2024-06-13 ENCOUNTER — Other Ambulatory Visit: Payer: Self-pay

## 2024-06-13 ENCOUNTER — Emergency Department (HOSPITAL_COMMUNITY)

## 2024-06-13 ENCOUNTER — Emergency Department (HOSPITAL_COMMUNITY)
Admission: EM | Admit: 2024-06-13 | Discharge: 2024-06-13 | Disposition: A | Discharge status: Home / Self Care | Attending: Emergency Medicine | Admitting: Emergency Medicine

## 2024-06-13 DIAGNOSIS — N200 Calculus of kidney: Secondary | ICD-10-CM | POA: Diagnosis not present

## 2024-06-13 DIAGNOSIS — E119 Type 2 diabetes mellitus without complications: Secondary | ICD-10-CM | POA: Diagnosis not present

## 2024-06-13 DIAGNOSIS — N3001 Acute cystitis with hematuria: Secondary | ICD-10-CM | POA: Insufficient documentation

## 2024-06-13 DIAGNOSIS — I10 Essential (primary) hypertension: Secondary | ICD-10-CM | POA: Insufficient documentation

## 2024-06-13 DIAGNOSIS — Z79899 Other long term (current) drug therapy: Secondary | ICD-10-CM | POA: Diagnosis not present

## 2024-06-13 DIAGNOSIS — Z7984 Long term (current) use of oral hypoglycemic drugs: Secondary | ICD-10-CM | POA: Insufficient documentation

## 2024-06-13 DIAGNOSIS — R109 Unspecified abdominal pain: Secondary | ICD-10-CM

## 2024-06-13 LAB — URINALYSIS, ROUTINE W REFLEX MICROSCOPIC
Bilirubin Urine: NEGATIVE
Glucose, UA: NEGATIVE mg/dL
Ketones, ur: NEGATIVE mg/dL
Nitrite: NEGATIVE
Protein, ur: 100 mg/dL — AB
RBC / HPF: >50 RBC/hpf (ref 0–5)
Specific Gravity, Urine: 1.02 (ref 1.005–1.030)
WBC, UA: >50 WBC/hpf (ref 0–5)
pH: 6 (ref 5.0–8.0)

## 2024-06-13 LAB — CBC
HCT: 42.2 % (ref 36.0–46.0)
Hemoglobin: 14.6 g/dL (ref 12.0–15.0)
MCH: 32.4 pg (ref 26.0–34.0)
MCHC: 34.6 g/dL (ref 30.0–36.0)
MCV: 93.6 fL (ref 80.0–100.0)
Platelets: 275 K/uL (ref 150–400)
RBC: 4.51 MIL/uL (ref 3.87–5.11)
RDW: 12.7 % (ref 11.5–15.5)
WBC: 10.9 K/uL — ABNORMAL HIGH (ref 4.0–10.5)
nRBC: 0 % (ref 0.0–0.2)

## 2024-06-13 LAB — PREGNANCY, URINE: Preg Test, Ur: NEGATIVE

## 2024-06-13 LAB — BASIC METABOLIC PANEL WITH GFR
Anion gap: 11 (ref 5–15)
BUN: 15 mg/dL (ref 6–20)
CO2: 26 mmol/L (ref 22–32)
Calcium: 9.3 mg/dL (ref 8.9–10.3)
Chloride: 98 mmol/L (ref 98–111)
Creatinine, Ser: 0.78 mg/dL (ref 0.44–1.00)
GFR, Estimated: >60 mL/min (ref >60)
Glucose, Bld: 116 mg/dL — ABNORMAL HIGH (ref 70–99)
Potassium: 3.7 mmol/L (ref 3.5–5.1)
Sodium: 135 mmol/L (ref 135–145)

## 2024-06-13 MED ORDER — ONDANSETRON HCL 4 MG/2ML IJ SOLN
4.0000 mg | Freq: Once | INTRAMUSCULAR | Status: AC
  Administered 2024-06-13: 4 mg via INTRAVENOUS
  Filled 2024-06-13: qty 2

## 2024-06-13 MED ORDER — CEPHALEXIN 500 MG PO CAPS: 500.0000 mg | ORAL_CAPSULE | Freq: Two times a day (BID) | ORAL | 0 refills | Status: AC

## 2024-06-13 MED ORDER — PHENAZOPYRIDINE HCL 100 MG PO TABS
200.0000 mg | ORAL_TABLET | Freq: Once | ORAL | Status: AC
  Administered 2024-06-13: 200 mg via ORAL
  Filled 2024-06-13: qty 2

## 2024-06-13 MED ORDER — MORPHINE SULFATE (PF) 4 MG/ML IV SOLN
4.0000 mg | Freq: Once | INTRAVENOUS | Status: AC
  Administered 2024-06-13: 4 mg via INTRAVENOUS
  Filled 2024-06-13: qty 1

## 2024-06-13 MED ORDER — SODIUM CHLORIDE 0.9 % IV SOLN
1.0000 g | Freq: Once | INTRAVENOUS | Status: AC
  Administered 2024-06-13: 1 g via INTRAVENOUS
  Filled 2024-06-13: qty 10

## 2024-06-13 MED ORDER — PHENAZOPYRIDINE HCL 200 MG PO TABS: 200.0000 mg | ORAL_TABLET | Freq: Three times a day (TID) | ORAL | 0 refills | Status: DC

## 2024-06-13 NOTE — ED Notes (Signed)
 Patient back from CT.

## 2024-06-13 NOTE — ED Provider Notes (Signed)
 [place] EMERGENCY DEPARTMENT AT [facility] Provider Note   CSN: [id number] Arrival date & time: 06/13/24  1556     Patient presents with: painful urination and Flank Pain   Susan Reeves is a 48 y.o. female.  {Add pertinent medical, surgical, social history, OB history to YEP:67052} The history is provided by the patient.       Prior to Admission medications   Medication Sig Start Date End Date Taking? Authorizing Provider  Accu-Chek Softclix Lancets lancets Use to check blood sugars 4 times daily. DX:E11.9 06/17/22   Cook, Jayce G, DO  amLODipine  (NORVASC ) 5 MG tablet Take 1 tablet (5 mg total) by mouth daily. 05/17/24   Cook, Jayce G, DO  blood glucose meter kit and supplies KIT Dispense based on patient and insurance preference. Use up to four times daily as directed. 06/17/22   Cook, Jayce G, DO  buPROPion  (WELLBUTRIN  SR) 150 MG 12 hr tablet 150 mg once daily for 3 days then increase to 150 mg twice daily 12/25/23   Cook, Jayce G, DO  cyanocobalamin (VITAMIN B12) 500 MCG tablet Take 500 mcg by mouth daily.    [provider]  Glucose Blood (BLOOD GLUCOSE TEST STRIPS 333) STRP Use to check blood sugars 4 times daily. Dx.E11.9 06/17/22   Cook, Jayce G, DO  Multiple Vitamin (MULTIVITAMIN ADULT PO) Take by mouth.    [provider]  propranolol  (INDERAL ) 40 MG tablet Take 1 tablet (40 mg total) by mouth daily. 12/25/23   Cook, Jayce G, DO  rosuvastatin  (CRESTOR ) 20 MG tablet TAKE 1 TABLET(20 MG) BY MOUTH DAILY 02/02/24   Cook, Jayce G, DO  tirzepatide  (MOUNJARO ) 5 MG/0.5ML Pen Inject 5 mg into the skin once a week. 08/25/23   Cook, Jayce G, DO    Allergies: Labetalol  and Other    Review of Systems  Updated Vital Signs BP 138/69   Pulse 70   Temp 98.2 F (36.8 C) (Oral)   Resp 18   Ht 5' 5.5 (1.664 m)   Wt 113.4 kg   LMP 06/06/2024 (Approximate)   SpO2 96%   BMI 40.97 kg/m   Physical Exam  (all labs ordered are listed, but only abnormal  results are displayed) Labs Reviewed  URINALYSIS, ROUTINE W REFLEX MICROSCOPIC - Abnormal; Notable for the following components:      Result Value   APPearance CLOUDY (*)    Hgb urine dipstick LARGE (*)    Protein, ur 100 (*)    Leukocytes,Ua MODERATE (*)    Bacteria, UA RARE (*)    All other components within normal limits  BASIC METABOLIC PANEL WITH GFR - Abnormal; Notable for the following components:   Glucose, Bld 116 (*)    All other components within normal limits  CBC - Abnormal; Notable for the following components:   WBC 10.9 (*)    All other components within normal limits  PREGNANCY, URINE    EKG: None  Radiology: CT Renal Stone Study Result Date: 06/13/2024 CLINICAL DATA:  Abdominal and flank pain.  Left flank pain. EXAM: CT ABDOMEN AND PELVIS WITHOUT CONTRAST TECHNIQUE: Multidetector CT imaging of the abdomen and pelvis was performed following the standard protocol without IV contrast. RADIATION DOSE REDUCTION: This exam was performed according to the departmental dose-optimization program which includes automated exposure control, adjustment of the mA and/or kV according to patient size and/or use of iterative reconstruction technique. COMPARISON:  None Available. FINDINGS: Lower chest: No acute abnormality. Calcified granuloma in  the left lower lobe. Hepatobiliary: No focal liver abnormality is seen. No gallstones, gallbladder wall thickening, or biliary dilatation. Pancreas: Unremarkable. No pancreatic ductal dilatation or surrounding inflammatory changes. Spleen: Normal in size without focal abnormality. Adrenals/Urinary Tract: There are punctate bilateral renal calculi. No hydronephrosis or perinephric fat stranding. Adrenal glands are within normal limits. There is mild bladder wall thickening with mild surrounding inflammation. Stomach/Bowel: Stomach is within normal limits. Appendix appears normal. No evidence of bowel wall thickening, distention, or inflammatory  changes. Vascular/Lymphatic: Aortic atherosclerosis. No enlarged abdominal or pelvic lymph nodes. Reproductive: There is a dominant follicle or small cyst in the left ovary measuring 2 cm. Adrenal glands and right ovary are within normal limits. Other: No abdominal wall hernia or abnormality. No abdominopelvic ascites. Musculoskeletal: No fracture is seen. IMPRESSION: 1. Mild bladder wall thickening with mild surrounding inflammation worrisome for cystitis. 2. Punctate bilateral nonobstructing renal calculi. 3. 2 cm dominant follicle or small cyst in the left ovary. No follow-up imaging recommended. 4. Aortic atherosclerosis. Aortic Atherosclerosis (ICD10-I70.0). Electronically Signed   By: Greig Pique M.D.   On: 06/13/2024 21:06    {Document cardiac monitor, telemetry assessment procedure when appropriate:32947} Procedures   Medications Ordered in the ED  morphine  (PF) 4 MG/ML injection 4 mg (4 mg Intravenous Given 06/13/24 2013)  ondansetron  (ZOFRAN ) injection 4 mg (4 mg Intravenous Given 06/13/24 2013)      {Click here for ABCD2, HEART and other calculators REFRESH Note before signing:1}                              Medical Decision Making Amount and/or Complexity of Data Reviewed Labs: ordered. Radiology: ordered.  Risk Prescription drug management.   ***  {Document critical care time when appropriate  Document review of labs and clinical decision tools ie CHADS2VASC2, etc  Document your independent review of radiology images and any outside records  Document your discussion with family members, caretakers and with consultants  Document social determinants of health affecting pt's care  Document your decision making why or why not admission, treatments were needed:32947:::1}   Final diagnoses:  None    ED Discharge Orders     None

## 2024-06-13 NOTE — ED Notes (Signed)
 Patient transported to CT

## 2024-06-13 NOTE — Discharge Instructions (Signed)
 You do have a urinary tract infection and your symptoms should resolve significantly over the next 12 hours with the Pyridium  prescribed.  Remember that this will turn your urine bright orange and is normal.  Complete the entire course of the antibiotics to get rid of this infection.  Make sure you are drinking plenty of fluids.  Get rechecked if you develop any fevers, uncontrolled vomiting or persistent symptoms.  As we discussed you do have tiny stones in both of your kidneys, however these are not the source of today's symptoms.  Be aware that at some point in time the stones will decide to move down through the ureters to your bladder and will cause significant pain.

## 2024-06-13 NOTE — ED Triage Notes (Signed)
 Pt via POV c/o burning urination, frequency, and left flank pain since around noon today. Pt notes small amount of blood on toilet tissue after urinating.

## 2024-06-23 ENCOUNTER — Ambulatory Visit: Payer: 59 | Admitting: Family Medicine

## 2024-06-29 ENCOUNTER — Ambulatory Visit (INDEPENDENT_AMBULATORY_CARE_PROVIDER_SITE_OTHER): Admitting: Family Medicine

## 2024-06-29 VITALS — BP 124/75 | HR 81 | Temp 98.8°F | Ht 65.5 in | Wt 254.4 lb

## 2024-06-29 DIAGNOSIS — E119 Type 2 diabetes mellitus without complications: Secondary | ICD-10-CM | POA: Diagnosis not present

## 2024-06-29 DIAGNOSIS — R300 Dysuria: Secondary | ICD-10-CM

## 2024-06-29 DIAGNOSIS — I10 Essential (primary) hypertension: Secondary | ICD-10-CM | POA: Diagnosis not present

## 2024-06-29 DIAGNOSIS — E782 Mixed hyperlipidemia: Secondary | ICD-10-CM

## 2024-06-29 NOTE — Patient Instructions (Signed)
 Labs today.   Sending urine for culture.  Follow up in 3-6 months (pending A1c).

## 2024-06-30 ENCOUNTER — Ambulatory Visit: Payer: Self-pay | Admitting: Family Medicine

## 2024-06-30 ENCOUNTER — Other Ambulatory Visit: Payer: Self-pay | Admitting: Family Medicine

## 2024-06-30 DIAGNOSIS — R300 Dysuria: Secondary | ICD-10-CM | POA: Insufficient documentation

## 2024-06-30 LAB — LIPID PANEL
Chol/HDL Ratio: 4.5 ratio — ABNORMAL HIGH (ref 0.0–4.4)
Cholesterol, Total: 185 mg/dL (ref 100–199)
HDL: 41 mg/dL (ref >39)
LDL Chol Calc (NIH): 110 mg/dL — ABNORMAL HIGH (ref 0–99)
Triglycerides: 196 mg/dL — ABNORMAL HIGH (ref 0–149)
VLDL Cholesterol Cal: 34 mg/dL (ref 5–40)

## 2024-06-30 LAB — HEMOGLOBIN A1C
Est. average glucose Bld gHb Est-mCnc: 174 mg/dL
Hgb A1c MFr Bld: 7.7 % — ABNORMAL HIGH (ref 4.8–5.6)

## 2024-06-30 MED ORDER — ROSUVASTATIN CALCIUM 40 MG PO TABS: 40.0000 mg | ORAL_TABLET | Freq: Every day | ORAL | 3 refills | Status: AC

## 2024-06-30 NOTE — Assessment & Plan Note (Signed)
 Uncontrolled. Increasing Crestor .

## 2024-06-30 NOTE — Assessment & Plan Note (Signed)
 Stable. No significant edema on exam. Advised to monitor. Continue Amlodipine .

## 2024-06-30 NOTE — Assessment & Plan Note (Signed)
Sending culture.

## 2024-06-30 NOTE — Progress Notes (Signed)
 Subjective:  Patient ID: Susan Reeves, female    DOB: [birth year]  Age: 48 y.o. MRN: [id number]  CC:  Follow up   HPI:  48 year old female with HTN, Migraine, HLD, DM-2 presents for follow up.  Patient reports intermittent swelling of the left foot for the past month. No significant swelling currently.  No recent fall, trauma, or injury.  HTN stable on Amlodipine .  Recent UTI. Was treated with Keflex . She has had some recurrent symptoms (dysuria). She is concerned about recurrence.  Patient needs labs. She reports that she has frequently missed doses of her Mounjaro .  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   Smoker 12/06/2015    Social Hx   Social History   Socioeconomic History   Marital status: Married    Spouse name: Not on file   Number of children: Not on file   Years of education: Not on file   Highest education level: Not on file  Occupational History   Not on file  Tobacco Use   Smoking status: Every Day    Current packs/day: 1.00    Average packs/day: 1 pack/day for 26.0 years (26.0 ttl pk-yrs)    Types: Cigarettes   Smokeless tobacco: Never  Vaping Use   Vaping status: Never Used  Substance and Sexual Activity   Alcohol use: No    Comment: not now   Drug use: No   Sexual activity: Yes    Birth control/protection: None  Other Topics Concern   Not on file  Social History Narrative   Not on file   Social Drivers of Health   Financial Resource Strain: Not on file  Food Insecurity: Not on file  Transportation Needs: Not on file  Physical Activity: Not on file  Stress: Not on file  Social Connections: Not on  file    Review of Systems Per HPI  Objective:  BP 124/75   Pulse 81   Temp 98.8 F (37.1 C)   Ht 5' 5.5 (1.664 m)   Wt 254 lb 6.4 oz (115.4 kg)   LMP 06/06/2024 (Approximate)   SpO2 97%   BMI 41.69 kg/m      06/29/2024    2:33 PM 06/29/2024    2:26 PM 06/13/2024    8:30 PM  BP/Weight  Systolic BP 124 146 138  Diastolic BP 75 80 69  Wt. (Lbs)  254.4   BMI  41.69 kg/m2     Physical Exam Vitals and nursing note reviewed.  Constitutional:      Appearance: Normal appearance. She is obese.  HENT:     Head: Normocephalic and atraumatic.  Cardiovascular:     Rate and Rhythm: Normal rate and regular rhythm.     Comments: No LE edema on exam. Pulmonary:     Effort: Pulmonary effort is normal.     Breath sounds: Normal breath sounds. No wheezing or rales.  Neurological:     Mental Status: She is alert.  Psychiatric:        Mood and Affect: Mood normal.        Behavior: Behavior normal.     Lab Results  Component Value Date   WBC 10.9 (  H) 06/13/2024   HGB 14.6 06/13/2024   HCT 42.2 06/13/2024   PLT 275 06/13/2024   GLUCOSE 116 (H) 06/13/2024   CHOL 185 06/29/2024   TRIG 196 (H) 06/29/2024   HDL 41 06/29/2024   LDLCALC 110 (H) 06/29/2024   ALT 16 12/25/2023   AST 16 12/25/2023   NA 135 06/13/2024   K 3.7 06/13/2024   CL 98 06/13/2024   CREATININE 0.78 06/13/2024   BUN 15 06/13/2024   CO2 26 06/13/2024   TSH 1.320 09/18/2023   HGBA1C 7.7 (H) 06/29/2024     Assessment & Plan:  Type 2 diabetes mellitus without complication, without long-term current use of insulin (HCC) Assessment & Plan: A1c uncontrolled. Due to missed doses of medication advised compliance with Mounjaro .  Orders: -     Hemoglobin A1c  Dysuria Assessment & Plan: Sending culture.  Orders: -     POCT URINALYSIS DIP (CLINITEK) -     Urine Culture  Mixed hyperlipidemia Assessment & Plan: Uncontrolled. Increasing Crestor .  Orders: -     Lipid panel -     Rosuvastatin  Calcium ;  Take 1 tablet (40 mg total) by mouth daily.  Dispense: 90 tablet; Refill: 3  Primary hypertension Assessment & Plan: Stable. No significant edema on exam. Advised to monitor. Continue Amlodipine .     Follow-up:  3-6 months  Korrine Sicard Bluford DO [facility]

## 2024-06-30 NOTE — Assessment & Plan Note (Signed)
 A1c uncontrolled. Due to missed doses of medication advised compliance with Mounjaro .

## 2024-07-01 LAB — URINE CULTURE

## 2024-07-01 LAB — SPECIMEN STATUS REPORT

## 2024-08-06 ENCOUNTER — Other Ambulatory Visit: Payer: Self-pay | Admitting: Family Medicine

## 2024-09-23 ENCOUNTER — Other Ambulatory Visit: Payer: Self-pay | Admitting: Family Medicine

## 2024-10-02 ENCOUNTER — Other Ambulatory Visit: Payer: Self-pay | Admitting: Family Medicine

## 2024-10-19 ENCOUNTER — Other Ambulatory Visit: Payer: Self-pay | Admitting: Family Medicine

## 2024-11-11 ENCOUNTER — Other Ambulatory Visit: Payer: Self-pay | Admitting: Family Medicine

## 2024-12-30 ENCOUNTER — Ambulatory Visit: Admitting: Family Medicine

## 2024-12-30 ENCOUNTER — Encounter: Payer: Self-pay | Admitting: Family Medicine

## 2024-12-30 VITALS — BP 138/78 | HR 88 | Temp 98.4°F | Ht 65.5 in | Wt 248.2 lb

## 2024-12-30 DIAGNOSIS — E119 Type 2 diabetes mellitus without complications: Secondary | ICD-10-CM

## 2024-12-30 DIAGNOSIS — G43909 Migraine, unspecified, not intractable, without status migrainosus: Secondary | ICD-10-CM | POA: Diagnosis not present

## 2024-12-30 DIAGNOSIS — Z1211 Encounter for screening for malignant neoplasm of colon: Secondary | ICD-10-CM

## 2024-12-30 DIAGNOSIS — I10 Essential (primary) hypertension: Secondary | ICD-10-CM

## 2024-12-30 DIAGNOSIS — E782 Mixed hyperlipidemia: Secondary | ICD-10-CM | POA: Diagnosis not present

## 2024-12-30 DIAGNOSIS — Z7985 Long-term (current) use of injectable non-insulin antidiabetic drugs: Secondary | ICD-10-CM

## 2024-12-30 MED ORDER — PROPRANOLOL HCL 40 MG PO TABS: ORAL_TABLET | ORAL | 3 refills | Status: AC

## 2024-12-30 MED ORDER — AMLODIPINE BESYLATE 5 MG PO TABS: ORAL_TABLET | ORAL | 3 refills | Status: AC

## 2024-12-30 NOTE — Progress Notes (Signed)
 "  Subjective:  Patient ID: Susan Reeves, female    DOB: September 26, 1976  Age: 49 y.o. MRN: [id number]  CC:   Chief Complaint  Patient presents with   Follow-up    PT is getting over being sick, states it might have been a cold.    HPI:  49 year old female presents for follow-up.  Patient getting over a viral illness.  Blood pressure fairly well-controlled here today.  Remains on amlodipine .  Also on propranolol  for migraine prophylaxis.  Last A1c was not at goal.  Needs A1c today.  Compliant with Mounjaro .  No reported side effects.  Continues to smoke.  Has tried Chantix in the past.  Currently on Wellbutrin .  Patient declines immunizations today.  She is due for mammogram as well as colonoscopy.  Declines colonoscopy.  Is amenable to Cologuard.  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   Smoker 12/06/2015    Social Hx   Social History   Socioeconomic History   Marital status: Married    Spouse name: Not on file   Number of children: Not on file   Years of education: Not on file   Highest education level: Not on file  Occupational History   Not on file  Tobacco Use   Smoking status: Every Day    Current packs/day: 1.00    Average packs/day: 1 pack/day for 26.0 years (26.0 ttl pk-yrs)    Types: Cigarettes   Smokeless tobacco: Never  Vaping Use   Vaping status: Never Used  Substance and Sexual Activity   Alcohol use: No    Comment: not now   Drug use: No   Sexual activity: Yes    Birth control/protection: None  Other Topics Concern   Not on file  Social History Narrative   Not on file   Social Drivers of Health   Tobacco Use: High Risk  (12/30/2024)   Patient History    Smoking Tobacco Use: Every Day    Smokeless Tobacco Use: Never    Passive Exposure: Not on file  Financial Resource Strain: Not on file  Food Insecurity: Not on file  Transportation Needs: Not on file  Physical Activity: Not on file  Stress: Not on file  Social Connections: Not on file  Depression (PHQ2-9): Medium Risk (12/30/2024)   Depression (PHQ2-9)    PHQ-2 Score: 7  Alcohol Screen: Not on file  Housing: Not on file  Utilities: Not on file  Health Literacy: Not on file    Review of Systems Per HPI  Objective:  BP 138/78   Pulse 88   Temp 98.4 F (36.9 C)   Ht 5' 5.5 (1.664 m)   Wt 248 lb 3.2 oz (112.6 kg)   SpO2 98%   BMI 40.67 kg/m      12/30/2024    9:46 AM 12/30/2024    9:45 AM 12/30/2024    9:08 AM  BP/Weight  Systolic BP 138 138 155  Diastolic BP 78 78 84  Wt. (Lbs)   248.2  BMI   40.67 kg/m2    Physical Exam Vitals and nursing note reviewed.  Constitutional:      General: She is not in acute distress.    Appearance: Normal  appearance. She is obese.  HENT:     Head: Normocephalic and atraumatic.  Eyes:     General:        Right eye: No discharge.        Left eye: No discharge.     Conjunctiva/sclera: Conjunctivae normal.  Cardiovascular:     Rate and Rhythm: Normal rate and regular rhythm.  Pulmonary:     Effort: Pulmonary effort is normal.     Breath sounds: Normal breath sounds. No wheezing, rhonchi or rales.  Neurological:     Mental Status: She is alert.  Psychiatric:        Mood and Affect: Mood normal.        Behavior: Behavior normal.     Lab Results  Component Value Date   WBC 10.9 (H) 06/13/2024   HGB 14.6 06/13/2024   HCT 42.2 06/13/2024   PLT 275 06/13/2024   GLUCOSE 116 (H) 06/13/2024   CHOL 185 06/29/2024   TRIG 196 (H) 06/29/2024   HDL 41 06/29/2024   LDLCALC 110 (H) 06/29/2024   ALT 16 12/25/2023   AST 16 12/25/2023   NA 135 06/13/2024   K 3.7 06/13/2024   CL 98 06/13/2024    CREATININE 0.78 06/13/2024   BUN 15 06/13/2024   CO2 26 06/13/2024   TSH 1.320 09/18/2023   HGBA1C 7.7 (H) 06/29/2024     Assessment & Plan:  Mixed hyperlipidemia Assessment & Plan: Lipid panel today to assess.  Continue statin.  Orders: -     Lipid panel  Type 2 diabetes mellitus without complication, without long-term current use of insulin (HCC) Assessment & Plan: Has not been at goal.  A1c today.  Continue Mounjaro .  Will titrate up if needed.  Orders: -     CMP14+EGFR -     Hemoglobin A1c -     Microalbumin / creatinine urine ratio  Colon cancer screening -     Cologuard  Primary hypertension Assessment & Plan: Stable.  Continue amlodipine .  Orders: -     amLODIPine  Besylate; TAKE 1 TABLET(5 MG) BY MOUTH DAILY  Dispense: 90 tablet; Refill: 3  Migraine without status migrainosus, not intractable, unspecified migraine type Assessment & Plan: Stable.  Continue propranolol .  Orders: -     Propranolol  HCl; TAKE 1 TABLET(40 MG) BY MOUTH DAILY AT 6 AM  Dispense: 90 tablet; Refill: 3    Follow-up: 6 months  Judythe Postema Bluford DO [facility] "

## 2024-12-30 NOTE — Assessment & Plan Note (Addendum)
Lipid panel today to assess.  Continue statin. 

## 2024-12-30 NOTE — Patient Instructions (Signed)
Labs today.  Call [phone] to schedule mammogram.  Follow up in 6 months.

## 2024-12-30 NOTE — Assessment & Plan Note (Signed)
 Stable  Continue propranolol

## 2024-12-30 NOTE — Assessment & Plan Note (Signed)
 Has not been at goal.  A1c today.  Continue Mounjaro .  Will titrate up if needed.

## 2024-12-30 NOTE — Assessment & Plan Note (Signed)
Stable. °-Continue amlodipine °

## 2024-12-31 LAB — CMP14+EGFR
ALT: 19 [IU]/L (ref 0–32)
AST: 17 [IU]/L (ref 0–40)
Albumin: 4.2 g/dL (ref 3.9–4.9)
Alkaline Phosphatase: 103 [IU]/L (ref 41–116)
BUN/Creatinine Ratio: 17 (ref 9–23)
BUN: 13 mg/dL (ref 6–24)
Bilirubin Total: 0.5 mg/dL (ref 0.0–1.2)
CO2: 24 mmol/L (ref 20–29)
Calcium: 9.5 mg/dL (ref 8.7–10.2)
Chloride: 98 mmol/L (ref 96–106)
Creatinine, Ser: 0.78 mg/dL (ref 0.57–1.00)
Globulin, Total: 2.9 g/dL (ref 1.5–4.5)
Glucose: 152 mg/dL — ABNORMAL HIGH (ref 70–99)
Potassium: 4.6 mmol/L (ref 3.5–5.2)
Sodium: 137 mmol/L (ref 134–144)
Total Protein: 7.1 g/dL (ref 6.0–8.5)
eGFR: 93 mL/min/{1.73_m2}

## 2024-12-31 LAB — LIPID PANEL
Chol/HDL Ratio: 3.5 ratio (ref 0.0–4.4)
Cholesterol, Total: 151 mg/dL (ref 100–199)
HDL: 43 mg/dL
LDL Chol Calc (NIH): 91 mg/dL (ref 0–99)
Triglycerides: 92 mg/dL (ref 0–149)
VLDL Cholesterol Cal: 17 mg/dL (ref 5–40)

## 2024-12-31 LAB — MICROALBUMIN / CREATININE URINE RATIO
Creatinine, Urine: 155.4 mg/dL
Microalb/Creat Ratio: 11 mg/g{creat} (ref 0–29)
Microalbumin, Urine: 16.7 ug/mL

## 2024-12-31 LAB — HEMOGLOBIN A1C
Est. average glucose Bld gHb Est-mCnc: 174 mg/dL
Hgb A1c MFr Bld: 7.7 % — ABNORMAL HIGH (ref 4.8–5.6)

## 2025-01-01 ENCOUNTER — Ambulatory Visit: Payer: Self-pay | Admitting: Family Medicine

## 2025-01-03 ENCOUNTER — Other Ambulatory Visit: Payer: Self-pay | Admitting: Family Medicine

## 2025-01-03 MED ORDER — TIRZEPATIDE 7.5 MG/0.5ML ~~LOC~~ SOAJ: 7.5000 mg | SUBCUTANEOUS | 1 refills | Status: AC

## 2025-01-26 ENCOUNTER — Encounter (HOSPITAL_COMMUNITY)

## 2025-01-26 DIAGNOSIS — Z1231 Encounter for screening mammogram for malignant neoplasm of breast: Secondary | ICD-10-CM

## 2025-06-29 ENCOUNTER — Ambulatory Visit: Admitting: Family Medicine
